# Patient Record
Sex: Male | Born: 1950 | Race: White | Hispanic: No | Marital: Married | Smoking: Never smoker
Health system: Southern US, Community
[De-identification: ages and names within clinical notes are randomized; demographics above are authoritative.]

## PROBLEM LIST (undated history)

## (undated) DIAGNOSIS — D45 Polycythemia vera: Secondary | ICD-10-CM

## (undated) DIAGNOSIS — M109 Gout, unspecified: Secondary | ICD-10-CM

## (undated) DIAGNOSIS — I10 Essential (primary) hypertension: Secondary | ICD-10-CM

## (undated) HISTORY — DX: Polycythemia vera: D45

## (undated) HISTORY — DX: Gout, unspecified: M10.9

## (undated) HISTORY — DX: Essential (primary) hypertension: I10

---

## 2010-06-22 ENCOUNTER — Ambulatory Visit (HOSPITAL_COMMUNITY)
Admission: RE | Admit: 2010-06-22 | Discharge: 2010-06-22 | Payer: Self-pay | Source: Home / Self Care | Admitting: Internal Medicine

## 2010-06-22 ENCOUNTER — Ambulatory Visit: Payer: Self-pay | Admitting: Internal Medicine

## 2012-04-09 ENCOUNTER — Encounter (INDEPENDENT_AMBULATORY_CARE_PROVIDER_SITE_OTHER): Payer: Managed Care, Other (non HMO) | Admitting: Internal Medicine

## 2012-04-09 DIAGNOSIS — N189 Chronic kidney disease, unspecified: Secondary | ICD-10-CM

## 2012-04-09 DIAGNOSIS — R748 Abnormal levels of other serum enzymes: Secondary | ICD-10-CM

## 2012-04-09 DIAGNOSIS — D45 Polycythemia vera: Secondary | ICD-10-CM

## 2012-10-01 ENCOUNTER — Encounter (INDEPENDENT_AMBULATORY_CARE_PROVIDER_SITE_OTHER): Payer: Managed Care, Other (non HMO)

## 2012-10-01 ENCOUNTER — Encounter: Payer: Managed Care, Other (non HMO) | Admitting: Internal Medicine

## 2012-10-01 DIAGNOSIS — D45 Polycythemia vera: Secondary | ICD-10-CM

## 2013-01-05 ENCOUNTER — Encounter (INDEPENDENT_AMBULATORY_CARE_PROVIDER_SITE_OTHER): Payer: Managed Care, Other (non HMO) | Admitting: Internal Medicine

## 2013-01-05 DIAGNOSIS — D45 Polycythemia vera: Secondary | ICD-10-CM

## 2013-05-06 ENCOUNTER — Encounter (INDEPENDENT_AMBULATORY_CARE_PROVIDER_SITE_OTHER): Payer: Managed Care, Other (non HMO)

## 2013-05-20 DIAGNOSIS — D751 Secondary polycythemia: Secondary | ICD-10-CM

## 2013-05-20 DIAGNOSIS — N289 Disorder of kidney and ureter, unspecified: Secondary | ICD-10-CM

## 2013-05-20 DIAGNOSIS — D696 Thrombocytopenia, unspecified: Secondary | ICD-10-CM

## 2013-06-17 ENCOUNTER — Encounter (INDEPENDENT_AMBULATORY_CARE_PROVIDER_SITE_OTHER): Payer: Managed Care, Other (non HMO)

## 2013-06-17 DIAGNOSIS — D751 Secondary polycythemia: Secondary | ICD-10-CM

## 2013-06-17 DIAGNOSIS — D696 Thrombocytopenia, unspecified: Secondary | ICD-10-CM

## 2013-06-17 DIAGNOSIS — R748 Abnormal levels of other serum enzymes: Secondary | ICD-10-CM

## 2013-06-30 ENCOUNTER — Encounter (INDEPENDENT_AMBULATORY_CARE_PROVIDER_SITE_OTHER): Payer: Self-pay | Admitting: *Deleted

## 2013-08-24 ENCOUNTER — Encounter (INDEPENDENT_AMBULATORY_CARE_PROVIDER_SITE_OTHER): Payer: Self-pay

## 2014-09-28 DIAGNOSIS — D751 Secondary polycythemia: Secondary | ICD-10-CM | POA: Diagnosis not present

## 2014-09-30 DIAGNOSIS — D509 Iron deficiency anemia, unspecified: Secondary | ICD-10-CM | POA: Diagnosis not present

## 2014-09-30 DIAGNOSIS — D751 Secondary polycythemia: Secondary | ICD-10-CM | POA: Diagnosis not present

## 2014-09-30 DIAGNOSIS — E611 Iron deficiency: Secondary | ICD-10-CM | POA: Diagnosis not present

## 2014-09-30 DIAGNOSIS — D45 Polycythemia vera: Secondary | ICD-10-CM | POA: Diagnosis not present

## 2014-12-09 DIAGNOSIS — D751 Secondary polycythemia: Secondary | ICD-10-CM | POA: Diagnosis not present

## 2014-12-28 DIAGNOSIS — D45 Polycythemia vera: Secondary | ICD-10-CM | POA: Diagnosis not present

## 2015-03-31 DIAGNOSIS — E611 Iron deficiency: Secondary | ICD-10-CM | POA: Diagnosis not present

## 2015-03-31 DIAGNOSIS — D45 Polycythemia vera: Secondary | ICD-10-CM | POA: Diagnosis not present

## 2015-03-31 DIAGNOSIS — D751 Secondary polycythemia: Secondary | ICD-10-CM | POA: Diagnosis not present

## 2015-05-25 ENCOUNTER — Encounter (INDEPENDENT_AMBULATORY_CARE_PROVIDER_SITE_OTHER): Payer: Self-pay | Admitting: *Deleted

## 2015-07-18 DIAGNOSIS — Z961 Presence of intraocular lens: Secondary | ICD-10-CM | POA: Diagnosis not present

## 2015-07-18 DIAGNOSIS — H43813 Vitreous degeneration, bilateral: Secondary | ICD-10-CM | POA: Diagnosis not present

## 2015-07-19 DIAGNOSIS — D45 Polycythemia vera: Secondary | ICD-10-CM | POA: Diagnosis not present

## 2015-08-04 DIAGNOSIS — H35032 Hypertensive retinopathy, left eye: Secondary | ICD-10-CM | POA: Diagnosis not present

## 2015-08-04 DIAGNOSIS — Z961 Presence of intraocular lens: Secondary | ICD-10-CM | POA: Diagnosis not present

## 2015-08-04 DIAGNOSIS — H04123 Dry eye syndrome of bilateral lacrimal glands: Secondary | ICD-10-CM | POA: Diagnosis not present

## 2015-08-04 DIAGNOSIS — H35031 Hypertensive retinopathy, right eye: Secondary | ICD-10-CM | POA: Diagnosis not present

## 2015-08-04 DIAGNOSIS — H35373 Puckering of macula, bilateral: Secondary | ICD-10-CM | POA: Diagnosis not present

## 2015-08-04 DIAGNOSIS — H5213 Myopia, bilateral: Secondary | ICD-10-CM | POA: Diagnosis not present

## 2015-09-28 DIAGNOSIS — D45 Polycythemia vera: Secondary | ICD-10-CM | POA: Diagnosis not present

## 2015-09-28 DIAGNOSIS — D751 Secondary polycythemia: Secondary | ICD-10-CM | POA: Diagnosis not present

## 2015-09-28 DIAGNOSIS — E611 Iron deficiency: Secondary | ICD-10-CM | POA: Diagnosis not present

## 2015-09-28 DIAGNOSIS — I10 Essential (primary) hypertension: Secondary | ICD-10-CM | POA: Diagnosis not present

## 2015-09-28 DIAGNOSIS — Z6830 Body mass index (BMI) 30.0-30.9, adult: Secondary | ICD-10-CM | POA: Diagnosis not present

## 2015-11-29 DIAGNOSIS — D45 Polycythemia vera: Secondary | ICD-10-CM | POA: Diagnosis not present

## 2015-12-13 DIAGNOSIS — D751 Secondary polycythemia: Secondary | ICD-10-CM | POA: Diagnosis not present

## 2016-01-27 DIAGNOSIS — D751 Secondary polycythemia: Secondary | ICD-10-CM | POA: Diagnosis not present

## 2016-08-28 ENCOUNTER — Encounter: Payer: Self-pay | Admitting: Internal Medicine

## 2016-09-19 ENCOUNTER — Ambulatory Visit (HOSPITAL_COMMUNITY): Payer: Managed Care, Other (non HMO)

## 2016-09-27 ENCOUNTER — Encounter (HOSPITAL_COMMUNITY): Payer: Self-pay

## 2016-09-27 ENCOUNTER — Encounter (HOSPITAL_COMMUNITY): Payer: Medicare Other | Attending: Oncology | Admitting: Oncology

## 2016-09-27 ENCOUNTER — Encounter (HOSPITAL_COMMUNITY): Payer: Medicare Other

## 2016-09-27 DIAGNOSIS — D751 Secondary polycythemia: Secondary | ICD-10-CM | POA: Insufficient documentation

## 2016-09-27 DIAGNOSIS — D45 Polycythemia vera: Secondary | ICD-10-CM | POA: Diagnosis not present

## 2016-09-27 LAB — CBC WITH DIFFERENTIAL/PLATELET
BASOS ABS: 0.1 10*3/uL (ref 0.0–0.1)
BASOS PCT: 1 %
EOS ABS: 0.4 10*3/uL (ref 0.0–0.7)
Eosinophils Relative: 6 %
HCT: 50.2 % (ref 39.0–52.0)
Hemoglobin: 17.3 g/dL — ABNORMAL HIGH (ref 13.0–17.0)
Lymphocytes Relative: 24 %
Lymphs Abs: 1.6 10*3/uL (ref 0.7–4.0)
MCH: 30.1 pg (ref 26.0–34.0)
MCHC: 34.5 g/dL (ref 30.0–36.0)
MCV: 87.5 fL (ref 78.0–100.0)
MONO ABS: 0.6 10*3/uL (ref 0.1–1.0)
MONOS PCT: 9 %
NEUTROS ABS: 4.1 10*3/uL (ref 1.7–7.7)
NEUTROS PCT: 60 %
Platelets: 128 10*3/uL — ABNORMAL LOW (ref 150–400)
RBC: 5.74 MIL/uL (ref 4.22–5.81)
RDW: 13.6 % (ref 11.5–15.5)
WBC: 6.7 10*3/uL (ref 4.0–10.5)

## 2016-09-27 LAB — COMPREHENSIVE METABOLIC PANEL
ALBUMIN: 4 g/dL (ref 3.5–5.0)
ALT: 57 U/L (ref 17–63)
ANION GAP: 5 (ref 5–15)
AST: 48 U/L — AB (ref 15–41)
Alkaline Phosphatase: 59 U/L (ref 38–126)
BUN: 14 mg/dL (ref 6–20)
CHLORIDE: 107 mmol/L (ref 101–111)
CO2: 26 mmol/L (ref 22–32)
Calcium: 9.2 mg/dL (ref 8.9–10.3)
Creatinine, Ser: 1.46 mg/dL — ABNORMAL HIGH (ref 0.61–1.24)
GFR calc Af Amer: 55 mL/min — ABNORMAL LOW (ref >60)
GFR calc non Af Amer: 48 mL/min — ABNORMAL LOW (ref >60)
GLUCOSE: 113 mg/dL — AB (ref 65–99)
POTASSIUM: 3.6 mmol/L (ref 3.5–5.1)
SODIUM: 138 mmol/L (ref 135–145)
Total Bilirubin: 0.8 mg/dL (ref 0.3–1.2)
Total Protein: 7 g/dL (ref 6.5–8.1)

## 2016-09-27 NOTE — Progress Notes (Signed)
[place] NOTE  Patient Care Team: Lance Price. Lance Court, MD as PCP - General  CHIEF COMPLAINTS/PURPOSE OF CONSULTATION:  Polycythemia  HISTORY OF PRESENTING ILLNESS:  Lance Price 66 y.o. male is here because of referral by Dr. Scotty Price for follow up of polycythemia vera. Patient is here because of transfer of care over from Lance Price due to her retiring.    As per Lance Price' note on 07/03/2013: " The patient does not have polycythemia vera. 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 The patient does not need any treatment with Hydrea to maintain an appropriate blood count. The patient has received intermittent phlebotomies in the past and that can continue depending on his hematocrit values. 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 Needs to be followed. He does not have any underlying causes for inappropriate erythropoietin production. The patient needs to be followed with his blood counts and treated with phlebotomy as indicated. Low platelets need to be monitored as well. Liver enzymes are elevated due to underlying liver steatosis."   As per Dr. Reynaldo Price note on 09/28/2015: "Lance Price is a 66 y.o. 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 Since that his hematocrit and hemoglobin has been normal and stable. He is slightly in deficiency state secondary to previous phlebotomies."  His lab results from 11/29/2015 show a WBC of 7.5, RBC of 5.84, Hemoglobin 16 g/dl, hematocrit 50.3%, and platelets at 158k.    Lance Price presents today for consultation and continued treatment of his polycythemia vera. I personally reviewed and went over labs with the patient.  He states his last lab work was done in May 2017. He was supposed to have labs done in July but the office  was closed in June.   He states his initial diagnosis was in 2010.   He states initially he was getting phlebotomies about every 3-6 weeks for first 3-4 years but as the years went on he got his phlebotomies less frequently. Last year he said he only had 1-3 phlebotomies total.   He states he is doing well and plays golf 3x a week. He retired 3 years ago, but is currently works 3 days a week because the person that took over his job when he retired recently quit and so he is working again.   Patient does not have any other questions or concerns at this time.  MEDICAL HISTORY:  Past Medical History:  Diagnosis Date  . Gout   . Hypertension   . Polycythemia vera ([place])     SURGICAL HISTORY: History reviewed. No pertinent surgical history.  SOCIAL HISTORY: Social History   Social History  . Marital status: Married    Spouse name: N/A  . Number of children: N/A  . Years of education: N/A   Occupational History  . Not on file.   Social History Main Topics  . Smoking status: Never Smoker  . Smokeless tobacco: Never Used  . Alcohol use No  . Drug use: No  . Sexual activity: Not Currently   Other Topics Concern  . Not on file   Social History Narrative  . No narrative on file    FAMILY HISTORY: History reviewed. No pertinent family history.  ALLERGIES:  has no allergies on file.  MEDICATIONS:  Current Outpatient Prescriptions  Medication Sig Dispense  Refill  . allopurinol (ZYLOPRIM) 100 MG tablet Take by mouth.    Marland Kitchen aspirin 81 MG chewable tablet Chew by mouth.    . metoprolol (LOPRESSOR) 50 MG tablet Take by mouth.    . triamterene-hydrochlorothiazide (DYAZIDE) 37.5-25 MG capsule Take by mouth.     No current facility-administered medications for this visit.    Review of Systems  Constitutional: Negative.   HENT: Negative.   Eyes: Negative.   Respiratory: Negative.   Cardiovascular: Negative.   Gastrointestinal: Negative.   Genitourinary: Negative.     Musculoskeletal: Negative.   Skin: Negative.   Neurological: Negative.   Endo/Heme/Allergies: Negative.   Psychiatric/Behavioral: Negative.   All other systems reviewed and are negative.  14 point ROS was done and is otherwise as detailed above or in HPI   PHYSICAL EXAMINATION: ECOG PERFORMANCE STATUS: 0 - Asymptomatic  Vitals:   09/27/16 0853  BP: (!) 161/79  Pulse: 60  Resp: 18  Temp: 98.5 F (36.9 C)   Filed Weights   09/27/16 0853  Weight: 187 lb (84.8 kg)     Physical Exam  Constitutional: He is oriented to person, place, and time and well-developed, well-nourished, and in no distress.  HENT:  Head: Normocephalic and atraumatic.  Eyes: Conjunctivae and EOM are normal. Pupils are equal, round, and reactive to light.  Neck: Normal range of motion. Neck supple.  Cardiovascular: Normal rate, regular rhythm and normal heart sounds.   Pulmonary/Chest: Effort normal and breath sounds normal.  Abdominal: Soft. Bowel sounds are normal.  Musculoskeletal: Normal range of motion.  Neurological: He is alert and oriented to person, place, and time. Gait normal.  Skin: Skin is warm and dry.  Nursing note and vitals reviewed.     LABORATORY DATA:  I have reviewed the data as listed No results found for: WBC, HGB, HCT, MCV, PLT CMP  No results found for: NA, K, CL, CO2, GLUCOSE, BUN, CREATININE, CALCIUM, PROT, ALBUMIN, AST, ALT, ALKPHOS, BILITOT, GFRNONAA, GFRAA     PATHOLOGY:     RADIOGRAPHIC STUDIES: I have personally reviewed the radiological images as listed and agreed with the findings in the report. No results found.  CT CHEST, ABDOMEN, AND PELVIS WITH CONTRAST 06/03/2013   IMPRESSION: No evidence of acute cardiopulmonary disease.  No suspicious lymphadenopathy in the chest, abdomen, or pelvis.  Spleen is normal in size.  Moderate hepatic steatosis. Mild wall thickening involving the terminal ileum, although without associated inflammatory changes,  nonspecific.     ASSESSMENT & PLAN:  Suspected secondary polycythemia.  PLAN: He did have a copy of a negative JAK2 exon 12 with him but otherwise I do not have records of his previous workup.  I will check the below labs and discuss the next plan of care at that time. He is asymptomatic.  RTC in 3 weeks for follow up and review lab results. Labs ordered prior to next visit as below.   ORDERS PLACED FOR THIS ENCOUNTER: Orders Placed This Encounter  Procedures  . CBC with Differential  . Comprehensive metabolic panel  . JAK2 V617F, w Reflex to CALR/E12/MPL  . Erythropoietin    MEDICATIONS PRESCRIBED THIS ENCOUNTER: Meds ordered this encounter  Medications  . allopurinol (ZYLOPRIM) 100 MG tablet    Sig: Take by mouth.  Marland Kitchen aspirin 81 MG chewable tablet    Sig: Chew by mouth.  . metoprolol (LOPRESSOR) 50 MG tablet    Sig: Take by mouth.  . triamterene-hydrochlorothiazide (DYAZIDE) 37.5-25 MG capsule    Sig:  Take by mouth.     All questions were answered. The patient knows to call the clinic with any problems, questions or concerns.  This document serves as a record of services personally performed by Twana First, MD. It was created on her behalf by Shirlean Mylar, a trained medical scribe. The creation of this record is based on the scribe's personal observations and the provider's statements to them. This document has been checked and approved by the attending provider.  I have reviewed the above documentation for accuracy and completeness and I agree with the above.  This note was electronically signed.    [employer]  09/27/2016 9:01 AM

## 2016-09-27 NOTE — Patient Instructions (Signed)
[place] at [facility] Discharge Instructions  RECOMMENDATIONS MADE BY THE CONSULTANT AND ANY TEST RESULTS WILL BE SENT TO YOUR REFERRING PHYSICIAN.  You were seen today by Dr. Twana First You will have lab work done today Follow up in 3 weeks See Amy up front for appointments   Thank you for choosing [place] at [facility] to provide your oncology and hematology care.  To afford each patient quality time with our provider, please arrive at least 15 minutes before your scheduled appointment time.    If you have a lab appointment with the [place] please come in thru the  Main Entrance and check in at the main information desk  You need to re-schedule your appointment should you arrive 10 or more minutes late.  We strive to give you quality time with our providers, and arriving late affects you and other patients whose appointments are after yours.  Also, if you no show three or more times for appointments you may be dismissed from the clinic at the providers discretion.     Again, thank you for choosing [facility].  Our hope is that these requests will decrease the amount of time that you wait before being seen by our physicians.       _____________________________________________________________  Should you have questions after your visit to [facility], please contact our office at [phone] between the hours of 8:30 a.m. and 4:30 p.m.  Voicemails left after 4:30 p.m. will not be returned until the following business day.  For prescription refill requests, have your pharmacy contact our office.       Resources For Cancer Patients and their Caregivers ? American Cancer Society: Can assist with transportation, wigs, general needs, runs Look Good Feel Better.        [phone] ? Cancer Care: Provides financial assistance, online support groups, medication/co-pay assistance.  1-800-813-HOPE  531-694-0572) ? [place] Assists [place] cancer patients and their families through emotional , educational and financial support.  772-438-3952 ? [place] DSS Where to apply for food stamps, Medicaid and utility assistance. 6308423661 ? RCATS: Transportation to medical appointments. 407-863-2561 ? Social Security Administration: May apply for disability if have a Stage IV cancer. 854-138-1306 [phone] ? [employer], Disability and Transit Services: Assists with nutrition, care and transit needs. [place] Support Programs: @10RELATIVEDAYS @ > Cancer Support Group  2nd Tuesday of the month 1pm-2pm, Journey Room  > Creative Journey  3rd Tuesday of the month 1130am-1pm, Journey Room  > Look Good Feel Better  1st Wednesday of the month 10am-12 noon, Journey Room (Call [place] to register [phone])

## 2016-10-05 LAB — CALR + JAK2 E12-15 + MPL (REFLEXED)

## 2016-10-05 LAB — JAK2 V617F, W REFLEX TO CALR/E12/MPL

## 2016-10-17 NOTE — Progress Notes (Signed)
[place], [place] [address]   CLINIC:  Medical Oncology/Hematology  PCP:  Deloria Lair, MD [place] [address] [phone]   REASON FOR VISIT:  Follow-up for Polycythemia vera  CURRENT THERAPY: Therapeutic phlebotomy PRN    HISTORY OF PRESENT ILLNESS:  (From Dr. Laverle Patter last note on 09/27/16)      INTERVAL HISTORY:  Lance Price 66 y.o. male presents for routine follow-up for polycythemia vera.   Overall, he reports feeling quite well. He is largely without complaints today. He remains active playing golf several days per week. He continues to work part-time as well.   For fun, he enjoys play golf and spending time with his grandchildren; granddaughter Johann Capers (22 yo) and grandson Phillip Heal (46 yo). His only son and the grandchildren live outside of [address] [place].    REVIEW OF SYSTEMS:  Review of Systems  Constitutional: Negative.  Negative for chills, fatigue and fever.  HENT:  Negative.  Negative for lump/mass and nosebleeds.   Eyes: Negative.   Respiratory: Negative.  Negative for cough and shortness of breath.   Cardiovascular: Negative.  Negative for chest pain and leg swelling.  Gastrointestinal: Negative.  Negative for abdominal pain, blood in stool, constipation, diarrhea, nausea and vomiting.  Endocrine: Negative.   Genitourinary: Negative.  Negative for dysuria and hematuria.   Musculoskeletal: Negative.  Negative for arthralgias.  Skin: Negative.  Negative for rash.  Neurological: Negative.  Negative for dizziness and headaches.  Hematological: Negative.  Negative for adenopathy. Does not bruise/bleed easily.  Psychiatric/Behavioral: Negative.  Negative for depression and sleep disturbance. The patient is not nervous/anxious.      PAST MEDICAL/SURGICAL HISTORY:  Past Medical History:  Diagnosis Date  . Gout   . Hypertension   . Polycythemia vera ([place])    History reviewed. No pertinent surgical  history.   SOCIAL HISTORY:  Social History   Social History  . Marital status: Married    Spouse name: N/A  . Number of children: N/A  . Years of education: N/A   Occupational History  . Not on file.   Social History Main Topics  . Smoking status: Never Smoker  . Smokeless tobacco: Never Used  . Alcohol use No  . Drug use: No  . Sexual activity: Not Currently   Other Topics Concern  . Not on file   Social History Narrative  . No narrative on file    FAMILY HISTORY:  History reviewed. No pertinent family history.  CURRENT MEDICATIONS:  Outpatient Encounter Prescriptions as of 10/18/2016  Medication Sig  . allopurinol (ZYLOPRIM) 100 MG tablet Take by mouth.  Marland Kitchen aspirin 81 MG chewable tablet Chew by mouth.  . metoprolol (LOPRESSOR) 50 MG tablet Take by mouth.  . triamterene-hydrochlorothiazide (DYAZIDE) 37.5-25 MG capsule Take by mouth.   No facility-administered encounter medications on file as of 10/18/2016.     ALLERGIES:  Not on File   PHYSICAL EXAM:  ECOG Performance status: 0 - Asymptomatic   Vitals:   10/18/16 1150 10/18/16 1204  BP: (!) 156/78 (!) 156/78  Pulse: (!) 58 (!) 58  Resp: 16 16  Temp: 98.7 F (37.1 C) 98.7 F (37.1 C)   Filed Weights   10/18/16 1150 10/18/16 1204  Weight: 185 lb (83.9 kg) 185 lb (83.9 kg)    Physical Exam  Constitutional: He is oriented to person, place, and time and well-developed, well-nourished, and in no distress.  HENT:  Head: Normocephalic.  Mouth/Throat: Oropharynx is clear and moist. No oropharyngeal exudate.  Eyes: Conjunctivae are normal. Pupils are equal, round, and reactive to light. No scleral icterus.  Neck: Normal range of motion. Neck supple.  Cardiovascular: Normal rate, regular rhythm and normal heart sounds.   Pulmonary/Chest: Effort normal and breath sounds normal. No respiratory distress.  Abdominal: Soft. Bowel sounds are normal. There is no tenderness.  Musculoskeletal: Normal range of  motion. He exhibits no edema.  Lymphadenopathy:    He has no cervical adenopathy.       Right: No supraclavicular adenopathy present.       Left: No supraclavicular adenopathy present.  Neurological: He is alert and oriented to person, place, and time. No cranial nerve deficit. Gait normal.  Skin: Skin is warm and dry. No rash noted.  Psychiatric: Mood, memory, affect and judgment normal.  Nursing note and vitals reviewed.    LABORATORY DATA:  I have reviewed the labs as listed.  CBC    Component Value Date/Time   WBC 6.7 09/27/2016 0910   RBC 5.74 09/27/2016 0910   HGB 17.3 (H) 09/27/2016 0910   HCT 50.2 09/27/2016 0910   PLT 128 (L) 09/27/2016 0910   MCV 87.5 09/27/2016 0910   MCH 30.1 09/27/2016 0910   MCHC 34.5 09/27/2016 0910   RDW 13.6 09/27/2016 0910   LYMPHSABS 1.6 09/27/2016 0910   MONOABS 0.6 09/27/2016 0910   EOSABS 0.4 09/27/2016 0910   BASOSABS 0.1 09/27/2016 0910   CMP Latest Ref Rng & Units 09/27/2016  Glucose 65 - 99 mg/dL 113(H)  BUN 6 - 20 mg/dL 14  Creatinine 0.61 - 1.24 mg/dL 1.46(H)  Sodium 135 - 145 mmol/L 138  Potassium 3.5 - 5.1 mmol/L 3.6  Chloride 101 - 111 mmol/L 107  CO2 22 - 32 mmol/L 26  Calcium 8.9 - 10.3 mg/dL 9.2  Total Protein 6.5 - 8.1 g/dL 7.0  Total Bilirubin 0.3 - 1.2 mg/dL 0.8  Alkaline Phos 38 - 126 U/L 59  AST 15 - 41 U/L 48(H)  ALT 17 - 63 U/L 57        PENDING LABS:    DIAGNOSTIC IMAGING:    PATHOLOGY:  CALR/JAK2 status: 09/27/16        ASSESSMENT & PLAN:   Polycythemia vera/Thrombocytopenia:  -JAK2 & CALR negative.  -Labs reviewed from 09/27/16 in detail with the patient today.  Hemoglobin 17.3/hematocrit 50.2. He has not had therapeutic phlebotomy since 12/2015. Given the stability of his hematocrit and patient being asymptomatic, we will hold off on any urgent phlebotomies until further evaluation is completed with US abdomen.  -We discussed possible causes of low platelets; differential diagnosis includes  fatty liver disease.  -Will obtain ultrasound abdomen for further evaluation of any possible liver disease or splenomegaly.   Mildly elevated AST:  -Could be secondary to fatty liver disease. Will obtain ultrasound abdomen for further evaluation.      Dispo:  -Ultrasound abd sometime within the next 1-2 weeks.  -Return to cancer center in 3-4 weeks for follow-up visit with Dr. Talbert Cage.    All questions were answered to patient's stated satisfaction. Encouraged patient to call with any new concerns or questions before his next visit to the cancer center and we can certain see him sooner, if needed.    Plan of care discussed with Dr. Talbert Cage, who agrees with the above aforementioned.      Orders placed this encounter:  Orders Placed This Encounter  Procedures  . US Abdomen Complete  .  CBC with Differential/Platelet  . Comprehensive metabolic panel  . Erythropoietin      Mike Craze, NP [place] [phone]

## 2016-10-18 ENCOUNTER — Encounter (HOSPITAL_COMMUNITY): Payer: Self-pay | Admitting: Adult Health

## 2016-10-18 ENCOUNTER — Encounter (HOSPITAL_BASED_OUTPATIENT_CLINIC_OR_DEPARTMENT_OTHER): Payer: Medicare Other | Admitting: Adult Health

## 2016-10-18 VITALS — BP 156/78 | HR 58 | Temp 98.7°F | Resp 16 | Ht 66.0 in | Wt 185.0 lb

## 2016-10-18 DIAGNOSIS — D45 Polycythemia vera: Secondary | ICD-10-CM | POA: Diagnosis not present

## 2016-10-18 DIAGNOSIS — D696 Thrombocytopenia, unspecified: Secondary | ICD-10-CM | POA: Diagnosis not present

## 2016-10-18 NOTE — Patient Instructions (Signed)
[place] at [facility] Discharge Instructions  RECOMMENDATIONS MADE BY THE CONSULTANT AND ANY TEST RESULTS WILL BE SENT TO YOUR REFERRING PHYSICIAN.  You were seen today by Mike Craze NP. [place] of abdomen in 1-2 weeks. Return in 3 weeks for follow up.   Thank you for choosing [place] at [facility] to provide your oncology and hematology care.  To afford each patient quality time with our provider, please arrive at least 15 minutes before your scheduled appointment time.    If you have a lab appointment with the [place] please come in thru the  Main Entrance and check in at the main information desk  You need to re-schedule your appointment should you arrive 10 or more minutes late.  We strive to give you quality time with our providers, and arriving late affects you and other patients whose appointments are after yours.  Also, if you no show three or more times for appointments you may be dismissed from the clinic at the providers discretion.     Again, thank you for choosing [facility].  Our hope is that these requests will decrease the amount of time that you wait before being seen by our physicians.       _____________________________________________________________  Should you have questions after your visit to [facility], please contact our office at [phone] between the hours of 8:30 a.m. and 4:30 p.m.  Voicemails left after 4:30 p.m. will not be returned until the following business day.  For prescription refill requests, have your pharmacy contact our office.       Resources For Cancer Patients and their Caregivers ? American Cancer Society: Can assist with transportation, wigs, general needs, runs Look Good Feel Better.        ([phone] ? Cancer Care: Provides financial assistance, online support groups, medication/co-pay assistance.  1-800-813-HOPE 225-106-9425) ? [place] Assists [place] cancer patients and their families through emotional , educational and financial support.  (801) 515-4031 ? [place] DSS Where to apply for food stamps, Medicaid and utility assistance. ([phone] 103-5490 ? RCATS: Transportation to medical appointments. 212-516-2136 ? Social Security Administration: May apply for disability if have a Stage IV cancer. 605-530-8184 520 201 9086 ? [employer], Disability and Transit Services: Assists with nutrition, care and transit needs. [place] Support Programs: @10RELATIVEDAYS @ > Cancer Support Group  2nd Tuesday of the month 1pm-2pm, Journey Room  > Creative Journey  3rd Tuesday of the month 1130am-1pm, Journey Room  > Look Good Feel Better  1st Wednesday of the month 10am-12 noon, Journey Room (Call [place] to register [phone])

## 2016-11-01 ENCOUNTER — Ambulatory Visit (HOSPITAL_COMMUNITY)
Admission: RE | Admit: 2016-11-01 | Discharge: 2016-11-01 | Disposition: A | Discharge status: Home / Self Care | Payer: Medicare Other | Source: Ambulatory Visit | Attending: Adult Health | Admitting: Adult Health

## 2016-11-01 DIAGNOSIS — K760 Fatty (change of) liver, not elsewhere classified: Secondary | ICD-10-CM | POA: Diagnosis not present

## 2016-11-01 DIAGNOSIS — D45 Polycythemia vera: Secondary | ICD-10-CM | POA: Diagnosis not present

## 2016-11-08 ENCOUNTER — Encounter (HOSPITAL_COMMUNITY): Payer: Self-pay

## 2016-11-08 ENCOUNTER — Encounter (HOSPITAL_COMMUNITY): Payer: Medicare Other | Attending: Oncology | Admitting: Oncology

## 2016-11-08 ENCOUNTER — Encounter (HOSPITAL_COMMUNITY): Payer: Medicare Other

## 2016-11-08 VITALS — BP 142/86 | HR 62 | Temp 98.6°F | Resp 18 | Wt 184.3 lb

## 2016-11-08 DIAGNOSIS — D45 Polycythemia vera: Secondary | ICD-10-CM | POA: Diagnosis not present

## 2016-11-08 LAB — COMPREHENSIVE METABOLIC PANEL
ALT: 71 U/L — ABNORMAL HIGH (ref 17–63)
ANION GAP: 9 (ref 5–15)
AST: 58 U/L — AB (ref 15–41)
Albumin: 4.2 g/dL (ref 3.5–5.0)
Alkaline Phosphatase: 74 U/L (ref 38–126)
BILIRUBIN TOTAL: 0.8 mg/dL (ref 0.3–1.2)
BUN: 15 mg/dL (ref 6–20)
CHLORIDE: 106 mmol/L (ref 101–111)
CO2: 23 mmol/L (ref 22–32)
Calcium: 9.3 mg/dL (ref 8.9–10.3)
Creatinine, Ser: 1.52 mg/dL — ABNORMAL HIGH (ref 0.61–1.24)
GFR calc Af Amer: 53 mL/min — ABNORMAL LOW (ref >60)
GFR, EST NON AFRICAN AMERICAN: 45 mL/min — AB (ref >60)
GLUCOSE: 121 mg/dL — AB (ref 65–99)
POTASSIUM: 3.7 mmol/L (ref 3.5–5.1)
Sodium: 138 mmol/L (ref 135–145)
Total Protein: 7.3 g/dL (ref 6.5–8.1)

## 2016-11-08 LAB — CBC WITH DIFFERENTIAL/PLATELET
BASOS ABS: 0.1 10*3/uL (ref 0.0–0.1)
Basophils Relative: 1 %
Eosinophils Absolute: 0.5 10*3/uL (ref 0.0–0.7)
Eosinophils Relative: 6 %
HEMATOCRIT: 52.6 % — AB (ref 39.0–52.0)
Hemoglobin: 18.1 g/dL — ABNORMAL HIGH (ref 13.0–17.0)
LYMPHS ABS: 2 10*3/uL (ref 0.7–4.0)
LYMPHS PCT: 26 %
MCH: 30.4 pg (ref 26.0–34.0)
MCHC: 34.4 g/dL (ref 30.0–36.0)
MCV: 88.4 fL (ref 78.0–100.0)
MONO ABS: 0.7 10*3/uL (ref 0.1–1.0)
MONOS PCT: 9 %
NEUTROS ABS: 4.7 10*3/uL (ref 1.7–7.7)
Neutrophils Relative %: 58 %
Platelets: 149 10*3/uL — ABNORMAL LOW (ref 150–400)
RBC: 5.95 MIL/uL — ABNORMAL HIGH (ref 4.22–5.81)
RDW: 13.3 % (ref 11.5–15.5)
WBC: 8 10*3/uL (ref 4.0–10.5)

## 2016-11-08 NOTE — Patient Instructions (Signed)
[place] at [facility] Discharge Instructions  RECOMMENDATIONS MADE BY THE CONSULTANT AND ANY TEST RESULTS WILL BE SENT TO YOUR REFERRING PHYSICIAN.  You were seen today by Dr. Twana First Follow up in 3 months with lab work We will refer you to pulmonary for sleep study See Amy up front for appointments   Thank you for choosing [place] at [facility] to provide your oncology and hematology care.  To afford each patient quality time with our provider, please arrive at least 15 minutes before your scheduled appointment time.    If you have a lab appointment with the [place] please come in thru the  Main Entrance and check in at the main information desk  You need to re-schedule your appointment should you arrive 10 or more minutes late.  We strive to give you quality time with our providers, and arriving late affects you and other patients whose appointments are after yours.  Also, if you no show three or more times for appointments you may be dismissed from the clinic at the providers discretion.     Again, thank you for choosing [facility].  Our hope is that these requests will decrease the amount of time that you wait before being seen by our physicians.       _____________________________________________________________  Should you have questions after your visit to [facility], please contact our office at [phone] between the hours of 8:30 a.m. and 4:30 p.m.  Voicemails left after 4:30 p.m. will not be returned until the following business day.  For prescription refill requests, have your pharmacy contact our office.       Resources For Cancer Patients and their Caregivers ? American Cancer Society: Can assist with transportation, wigs, general needs, runs Look Good Feel Better.        [phone] ? Cancer Care: Provides financial assistance, online support groups, medication/co-pay  assistance.  1-800-813-HOPE 657-101-2011) ? [facility] Assists [place] cancer patients and their families through emotional , educational and financial support.  415-551-4609 ? [place] DSS Where to apply for food stamps, Medicaid and utility assistance. 507-327-1443 ? RC[employer]: Transportation to medical appointments. 765-440-7265 ? Social Security Administration: May apply for disability if have a Stage IV cancer. 872-393-8285 714-226-0410 ? [employer], Disability and Transit Services: Assists with nutrition, care and transit needs. [place] Support Programs: @10RELATIVEDAYS @ > Cancer Support Group  2nd Tuesday of the month 1pm-2pm, Journey Room  > Creative Journey  3rd Tuesday of the month 1130am-1pm, Journey Room  > Look Good Feel Better  1st Wednesday of the month 10am-12 noon, Journey Room (Call [place] to register [phone])

## 2016-11-08 NOTE — Progress Notes (Signed)
[facility] [place], [place] [address]   CLINIC:  Medical Oncology/Hematology  PCP:  Deloria Lair, MD [place] [address] [phone]   REASON FOR VISIT:  Follow-up for Polycythemia vera  CURRENT THERAPY: Therapeutic phlebotomy PRN    HISTORY OF PRESENT ILLNESS:      INTERVAL HISTORY:  Lance Price 66 y.o. male presents for routine follow-up for secondary polycythemia vera, JAK2 negative.  Patient states he feels well today. He had an abd US performed after his last visit to evaluate for splenomegaly since he had new onset thrombocytopenia. His abd [place] was negative except for mild hepatosteatosis. Platelets have improved on their own and is up to 149k today. He has no complaints today.   REVIEW OF SYSTEMS:  Review of Systems  Constitutional: Negative.  Negative for appetite change, chills, fatigue and fever.  HENT:  Negative.  Negative for hearing loss, lump/mass, mouth sores, nosebleeds, sore throat and tinnitus.   Eyes: Negative.  Negative for eye problems and icterus.  Respiratory: Negative.  Negative for chest tightness, cough, hemoptysis, shortness of breath and wheezing.   Cardiovascular: Negative.  Negative for chest pain, leg swelling and palpitations.  Gastrointestinal: Negative.  Negative for abdominal distention, abdominal pain, blood in stool, constipation, diarrhea, nausea and vomiting.  Endocrine: Negative.  Negative for hot flashes.  Genitourinary: Negative.  Negative for difficulty urinating, dysuria, frequency and hematuria.   Musculoskeletal: Negative.  Negative for arthralgias and neck pain.  Skin: Negative.  Negative for itching and rash.  Neurological: Negative.  Negative for dizziness, headaches and speech difficulty.  Hematological: Negative.  Negative for adenopathy. Does not bruise/bleed easily.  Psychiatric/Behavioral: Negative.  Negative for confusion, depression and sleep disturbance. The patient  is not nervous/anxious.      PAST MEDICAL/SURGICAL HISTORY:  Past Medical History:  Diagnosis Date  . Gout   . Hypertension   . Polycythemia vera ([place])    History reviewed. No pertinent surgical history.   SOCIAL HISTORY:  Social History   Social History  . Marital status: Married    Spouse name: N/A  . Number of children: N/A  . Years of education: N/A   Occupational History  . Not on file.   Social History Main Topics  . Smoking status: Never Smoker  . Smokeless tobacco: Never Used  . Alcohol use No  . Drug use: No  . Sexual activity: Not Currently   Other Topics Concern  . Not on file   Social History Narrative  . No narrative on file    FAMILY HISTORY:  History reviewed. No pertinent family history.  CURRENT MEDICATIONS:  Outpatient Encounter Prescriptions as of 11/08/2016  Medication Sig  . allopurinol (ZYLOPRIM) 100 MG tablet Take by mouth.  Marland Kitchen aspirin 81 MG tablet Take 81 mg by mouth daily.  . metoprolol (LOPRESSOR) 50 MG tablet Take by mouth.  . triamterene-hydrochlorothiazide (DYAZIDE) 37.5-25 MG capsule Take by mouth.  . [DISCONTINUED] aspirin 81 MG chewable tablet Chew by mouth.   No facility-administered encounter medications on file as of 11/08/2016.     ALLERGIES:  Not on File   PHYSICAL EXAM:  ECOG Performance status: 0 - Asymptomatic   Vitals:   11/08/16 0946  BP: (!) 142/86  Pulse: 62  Resp: 18  Temp: 98.6 F (37 C)   Filed Weights   11/08/16 0946  Weight: 184 lb 4.8 oz (83.6 kg)    Physical Exam  Constitutional: He is oriented to  person, place, and time and well-developed, well-nourished, and in no distress. No distress.  HENT:  Head: Normocephalic and atraumatic.  Mouth/Throat: Oropharynx is clear and moist. No oropharyngeal exudate.  Eyes: Conjunctivae are normal. Pupils are equal, round, and reactive to light. No scleral icterus.  Neck: Normal range of motion. Neck supple. No JVD present.  Cardiovascular: Normal rate,  regular rhythm and normal heart sounds.  Exam reveals no gallop and no friction rub.   No murmur heard. Pulmonary/Chest: Effort normal and breath sounds normal. No respiratory distress. He has no wheezes. He has no rales.  Abdominal: Soft. Bowel sounds are normal. He exhibits no distension. There is no tenderness. There is no guarding.  Musculoskeletal: Normal range of motion. He exhibits no edema or tenderness.  Lymphadenopathy:    He has no cervical adenopathy.       Right: No supraclavicular adenopathy present.       Left: No supraclavicular adenopathy present.  Neurological: He is alert and oriented to person, place, and time. No cranial nerve deficit. Gait normal.  Skin: Skin is warm and dry. No rash noted. No erythema. No pallor.  Psychiatric: Mood, memory, affect and judgment normal.  Nursing note and vitals reviewed.    LABORATORY DATA:  I have reviewed the labs as listed.  CBC    Component Value Date/Time   WBC 8.0 11/08/2016 0904   RBC 5.95 (H) 11/08/2016 0904   HGB 18.1 (H) 11/08/2016 0904   HCT 52.6 (H) 11/08/2016 0904   PLT 149 (L) 11/08/2016 0904   MCV 88.4 11/08/2016 0904   MCH 30.4 11/08/2016 0904   MCHC 34.4 11/08/2016 0904   RDW 13.3 11/08/2016 0904   LYMPHSABS 2.0 11/08/2016 0904   MONOABS 0.7 11/08/2016 0904   EOSABS 0.5 11/08/2016 0904   BASOSABS 0.1 11/08/2016 0904   CMP Latest Ref Rng & Units 11/08/2016 09/27/2016  Glucose 65 - 99 mg/dL 121(H) 113(H)  BUN 6 - 20 mg/dL 15 14  Creatinine 0.61 - 1.24 mg/dL 1.52(H) 1.46(H)  Sodium 135 - 145 mmol/L 138 138  Potassium 3.5 - 5.1 mmol/L 3.7 3.6  Chloride 101 - 111 mmol/L 106 107  CO2 22 - 32 mmol/L 23 26  Calcium 8.9 - 10.3 mg/dL 9.3 9.2  Total Protein 6.5 - 8.1 g/dL 7.3 7.0  Total Bilirubin 0.3 - 1.2 mg/dL 0.8 0.8  Alkaline Phos 38 - 126 U/L 74 59  AST 15 - 41 U/L 58(H) 48(H)  ALT 17 - 63 U/L 71(H) 57        DIAGNOSTIC IMAGING:  EXAM: ABDOMEN ULTRASOUND COMPLETE  COMPARISON:  [facility] CT chest abdomen and pelvis 06/03/2013.  FINDINGS: Gallbladder: No gallstones or wall thickening visualized. No sonographic Murphy sign noted by sonographer.  Common bile duct: Diameter: 4 mm, normal  Liver: Mildly increased liver echogenicity compared to the right kidney. Overall liver size appears within normal limits. No discrete liver lesion. No intrahepatic biliary ductal dilatation.  IVC: No abnormality visualized.  Pancreas: Diminutive on the prior CT. Incompletely visualized due to overlying bowel gas, visualized portions within normal limits.  Spleen: Splenic size appears stable and within normal limits, splenic length 9.6 cm today. No discrete splenic lesion.  Right Kidney: Length: 12.3 cm. Chronic cortical volume loss. Echogenicity within normal limits. No mass or hydronephrosis visualized.  Left Kidney: Length: 12.3 cm. Chronic cortical volume loss. Small chronic and simple appearing 18 mm upper pole cortical cyst. Echogenicity within normal limits. No mass or hydronephrosis visualized.  Abdominal aorta: No aneurysm visualized.  Other findings: None.  IMPRESSION: 1. Negative for hepatosplenomegaly. Mild hepatic steatosis suspected. No discrete liver or splenic lesion. 2. No acute findings in the abdomen.   PATHOLOGY:  CALR/JAK2 status: 09/27/16        ASSESSMENT & PLAN:   Secondary Polycythemia vera/Thrombocytopenia:  -JAK2 & CALR negative.  - Suspect his polycythemia may be due to underlying OSA. He states that he snores a lot at night time. Will refer to pulmonary for sleep study. - No phlebotomies necessary at this time. Patient states he feels very drained after receiving phlebotomies.  Mildly elevated AST:  -secondary to fatty liver disease.  - counseled on low fat/low cholesterol diet.   Dispo:  -Return to cancer center in 3 months for follow up with labs   Orders placed this encounter:  Orders Placed This Encounter   Procedures  . CBC with Differential    Standing Status:   Future    Standing Expiration Date:   04/10/2017  . Comprehensive metabolic panel    Standing Status:   Future    Standing Expiration Date:   04/10/2017

## 2016-11-09 LAB — ERYTHROPOIETIN: Erythropoietin: 13.5 m[IU]/mL (ref 2.6–18.5)

## 2016-11-23 ENCOUNTER — Institutional Professional Consult (permissible substitution): Payer: Medicare Other | Admitting: Pulmonary Disease

## 2016-12-13 ENCOUNTER — Encounter: Payer: Self-pay | Admitting: Pulmonary Disease

## 2016-12-13 ENCOUNTER — Ambulatory Visit (INDEPENDENT_AMBULATORY_CARE_PROVIDER_SITE_OTHER): Payer: Medicare Other | Admitting: Pulmonary Disease

## 2016-12-13 VITALS — BP 132/72 | HR 59 | Ht 66.0 in | Wt 183.8 lb

## 2016-12-13 DIAGNOSIS — G4733 Obstructive sleep apnea (adult) (pediatric): Secondary | ICD-10-CM | POA: Diagnosis not present

## 2016-12-13 DIAGNOSIS — G4710 Hypersomnia, unspecified: Secondary | ICD-10-CM

## 2016-12-13 NOTE — Assessment & Plan Note (Signed)
Patient was diagnosed with polycythemia vera in 2010. He follows up with Dr.Zhou. She recently saw him a couple months ago. Workup has been unremarkable. She was concerned about sleep apnea causing PV.   Patient is married, has snoring. He snores, occasional gasping or choking. He sleeps 5-6 hrs/night. Has naps in pm. Has some hypersomnia in am.  Hypersomnia affects his fxnality.  (-) abnormal behavior in sleep.   ESS 6.   Plan :  We discussed about the diagnosis of Obstructive Sleep Apnea (OSA) and implications of untreated OSA. We discussed about CPAP and BiPaP as possible treatment options.    We will schedule the patient for a sleep study. Plan for home sleep study. Suspicion for him having sleep apnea is low. If home sleep study is negative, I do not necessarily think he needs a lab study. If sleep study is positive, I will try CPAP therapy for several months and see how it affects his PV. He is not necessarily too symptomatic from hypersomnia point of view.   Patient was instructed to call the office if he/she has not heard back from the office 1-2 weeks after the sleep study.   Patient was instructed to call the office if he/she is having issues with the PAP device.   We discussed good sleep hygiene.   Patient was advised not to engage in activities requiring concentration and/or vigilance if he/she is sleepy.  Patient was advised not to drive if he/she is sleepy.

## 2016-12-13 NOTE — Patient Instructions (Signed)
It was a pleasure taking care of you today!  We will schedule you to have a sleep study to determine if you have sleep apnea.   We will get a home sleep test.  You will be instructed to come back to the office to get an apparatus to sleep with overnight.  Once we have the apparatus, it will usually take [place] 1-2 weeks to read the study and get back at you with results of the test.  Please give [place] a call in 2 weeks after your study if you do not hear back from [place].    If the sleep study is positive, we will order you a CPAP  machine.  Please call the office if you do NOT receive your machine in the next 1-2 weeks.   Please make sure you use your CPAP device everytime you sleep.  We will monitor the usage of your machine per your insurance requirement.  Your insurance company may take the machine from you if you are not using it regularly.   Please clean the mask, tubings, filter, water reservoir with soapy water every week.  Please use distilled water for the water reservoir.   Please call the office or your machine provider (DME company) if you are having issues with the device.   Return to clinic in 8-10 weeks with NP

## 2016-12-13 NOTE — Progress Notes (Signed)
Subjective:    Patient ID: Lance Price, male    DOB: July 21, 1951, 66 y.o.   MRN: [id number]  HPI   This is the case of Lance Price, 66 y.o. Male, who was referred by Dr. Twana First in consultation regarding possible OSA.    As you very well know, patient non smoker, not known  to have asthma or copd. Patient was diagnosed with polycythemia vera in 2010. He follows up with Dr.Zhou. She recently saw him a couple months ago. Workup has been unremarkable. She was concerned about sleep apnea causing PV.   Patient is married, has snoring. He snores, occasional gasping or choking. He sleeps 5-6 hrs/night. Has naps in pm. Has some hypersomnia in am.  Hypersomnia affects his fxnality.  (-) abnormal behavior in sleep.    ESS 6    Review of Systems  Constitutional: Negative.  Negative for fever and unexpected weight change.  HENT: Negative.  Negative for congestion, dental problem, ear pain, nosebleeds, postnasal drip, rhinorrhea, sinus pressure, sneezing, sore throat and trouble swallowing.   Eyes: Negative.  Negative for redness and itching.  Respiratory: Negative.  Negative for cough, chest tightness, shortness of breath and wheezing.   Cardiovascular: Negative.  Negative for palpitations and leg swelling.  Gastrointestinal: Negative.  Negative for nausea and vomiting.  Endocrine: Negative.   Genitourinary: Negative.  Negative for dysuria.  Musculoskeletal: Negative.  Negative for joint swelling.  Skin: Positive for pallor. Negative for rash.  Allergic/Immunologic: Negative.  Negative for environmental allergies, food allergies and immunocompromised state.  Neurological: Negative.  Negative for headaches.  Hematological: Negative.  Does not bruise/bleed easily.  Psychiatric/Behavioral: Negative.  Negative for dysphoric mood. The patient is not nervous/anxious.     Past Medical History:  Diagnosis Date  . Gout   . Hypertension   . Polycythemia vera ([place])     (-) CA, DVT CKD  st 3  No family history on file.  Mother had lung CA. Father had CAD.   No past surgical history on file.  S/P rectal abscess surgery.   Social History   Social History  . Marital status: Married    Spouse name: N/A  . Number of children: N/A  . Years of education: N/A   Occupational History  . Not on file.   Social History Main Topics  . Smoking status: Never Smoker  . Smokeless tobacco: Never Used  . Alcohol use No  . Drug use: No  . Sexual activity: Not Currently   Other Topics Concern  . Not on file   Social History Narrative  . No narrative on file   Lives in [place], works part time, has a son.   No Known Allergies   Outpatient Medications Prior to Visit  Medication Sig Dispense Refill  . allopurinol (ZYLOPRIM) 100 MG tablet Take by mouth.    Marland Kitchen aspirin 81 MG tablet Take 81 mg by mouth daily.    . metoprolol (LOPRESSOR) 50 MG tablet Take by mouth.    . triamterene-hydrochlorothiazide (DYAZIDE) 37.5-25 MG capsule Take by mouth.     No facility-administered medications prior to visit.    No orders of the defined types were placed in this encounter.        Objective:   Physical Exam  Vitals:  Vitals:   12/13/16 1535  BP: 132/72  Pulse: (!) 59  SpO2: 97%  Weight: 183 lb 12.8 oz (83.4 kg)  Height: 5\' 6"  (1.676 m)    Constitutional/General:  Pleasant, well-nourished,  well-developed, not in any distress,  Comfortably seating.  Well kempt  Body mass index is 29.67 kg/m. Wt Readings from Last 3 Encounters:  12/13/16 183 lb 12.8 oz (83.4 kg)  11/08/16 184 lb 4.8 oz (83.6 kg)  10/18/16 185 lb (83.9 kg)     HEENT: Pupils equal and reactive to light and accommodation. Anicteric sclerae. Normal nasal mucosa.   No oral  lesions,  mouth clear,  oropharynx clear, no postnasal drip. (-) Oral thrush. No dental caries.  Airway - Mallampati class III  Neck: No masses. Midline trachea. No JVD, (-) LAD. (-) bruits appreciated.  Respiratory/Chest: Grossly  normal chest. (-) deformity. (-) Accessory muscle use.  Symmetric expansion. (-) Tenderness on palpation.  Resonant on percussion.  Diminished BS on both lower lung zones. (-) wheezing, crackles, rhonchi (-) egophony  Cardiovascular: Regular rate and  rhythm, heart sounds normal, no murmur or gallops, no peripheral edema  Gastrointestinal:  Normal bowel sounds. Soft, non-tender. No hepatosplenomegaly.  (-) masses.   Musculoskeletal:  Normal muscle tone. Normal gait.   Extremities: Grossly normal. (-) clubbing, cyanosis.  (-) edema  Skin: (-) rash,lesions seen.   Neurological/Psychiatric : alert, oriented to time, place, person. Normal mood and affect          Assessment & Plan:  Hypersomnia Patient was diagnosed with polycythemia vera in 2010. He follows up with Dr.Zhou. She recently saw him a couple months ago. Workup has been unremarkable. She was concerned about sleep apnea causing PV.   Patient is married, has snoring. He snores, occasional gasping or choking. He sleeps 5-6 hrs/night. Has naps in pm. Has some hypersomnia in am.  Hypersomnia affects his fxnality.  (-) abnormal behavior in sleep.   ESS 6.   Plan :  We discussed about the diagnosis of Obstructive Sleep Apnea (OSA) and implications of untreated OSA. We discussed about CPAP and BiPaP as possible treatment options.    We will schedule the patient for a sleep study. Plan for home sleep study. Suspicion for him having sleep apnea is low. If home sleep study is negative, I do not necessarily think he needs a lab study. If sleep study is positive, I will try CPAP therapy for several months and see how it affects his PV. He is not necessarily too symptomatic from hypersomnia point of view.   Patient was instructed to call the office if he/she has not heard back from the office 1-2 weeks after the sleep study.   Patient was instructed to call the office if he/she is having issues with the PAP device.   We  discussed good sleep hygiene.   Patient was advised not to engage in activities requiring concentration and/or vigilance if he/she is sleepy.  Patient was advised not to drive if he/she is sleepy.       Thank you very much for letting me participate in this patient's care. Please do not hesitate to give me a call if you have any questions or concerns regarding the treatment plan.   Patient will follow up with [place] after the sleep study.     Monica Becton, MD 12/13/2016   4:04 PM [facility] [place] Pager: [phone] Office: [phone], Fax: [phone]

## 2017-01-10 DIAGNOSIS — G4733 Obstructive sleep apnea (adult) (pediatric): Secondary | ICD-10-CM | POA: Diagnosis not present

## 2017-01-22 ENCOUNTER — Other Ambulatory Visit: Payer: Self-pay | Admitting: *Deleted

## 2017-01-22 ENCOUNTER — Telehealth: Payer: Self-pay | Admitting: Pulmonary Disease

## 2017-01-22 DIAGNOSIS — G4733 Obstructive sleep apnea (adult) (pediatric): Secondary | ICD-10-CM | POA: Diagnosis not present

## 2017-01-22 NOTE — Telephone Encounter (Signed)
Per RA, HST showed mild OSA. Does not recommend a CPAP machine.

## 2017-01-24 NOTE — Telephone Encounter (Signed)
Pt returning call for results can be reached on cell @ [phone].Lance Price

## 2017-01-24 NOTE — Telephone Encounter (Signed)
Left another message for patient to call back 

## 2017-01-24 NOTE — Telephone Encounter (Signed)
Patient returned phone call..ert ° °

## 2017-01-24 NOTE — Telephone Encounter (Signed)
Spoke with pt. He is aware of his sleep study results. Pt had an upcoming appointment with SG on 02/14/2017, he asked that I cancel this. OV has been canceled. Nothing further was needed.

## 2017-01-24 NOTE — Telephone Encounter (Signed)
Left message to call back for HST results.

## 2017-02-06 NOTE — Progress Notes (Signed)
Deloria Lair., MD [place] [address]  Polycythemia, secondary - Plan: CBC with Differential  Thrombocytopenia ([place]) - Plan: Pathologist smear review, Vitamin B12, Folate, Iron and TIBC, Ferritin, Rapid HIV screen (HIV 1/2 Ab+Ag), Hepatitis panel, acute, Copper, serum  CURRENT THERAPY: Observation  INTERVAL HISTORY: Lance Price 66 y.o. 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  He underwent his sleep study which demonstrated mild OSA with hypopneas causing oxygen desaturation.  He was initially managed at [facility] in [place], [place] with Dr. Jacquiline Doe AND Thrombocytopenia, stable, mild.  HPI Elements   Location: Blood  Quality: Polycythemia, secondary  Severity: Mild-moderate  Duration:   Context: OSA  Timing:   Modifying Factors: Negative JAK2 V617F/CALR/MPL/JAK2 exon 12-15 testing.  Associated Signs & Symptoms:    He reports having undergone regular phlebotomies at [facility] and he is wondering why we have not been.  The patient is provided education regarding secondary polycythemia versus polycythemia vera.  He is provided education regarding the management differences of each.  Review of Systems  Constitutional: Negative.  Negative for chills, fever and weight loss.  HENT: Negative.   Eyes: Negative.   Respiratory: Negative.  Negative for cough.   Cardiovascular: Negative.  Negative for chest pain.  Gastrointestinal: Negative.  Negative for blood in stool, constipation, diarrhea, melena, nausea and vomiting.  Genitourinary: Negative.   Musculoskeletal: Negative.   Skin: Negative.   Neurological: Negative.  Negative for weakness.  Endo/Heme/Allergies: Negative.   Psychiatric/Behavioral: Negative.     Past Medical History:  Diagnosis Date  . Gout   . Hypertension   . Polycythemia vera ([place])     History reviewed. No pertinent surgical  history.  History reviewed. No pertinent family history.  Social History   Social History  . Marital status: Married    Spouse name: N/A  . Number of children: N/A  . Years of education: N/A   Social History Main Topics  . Smoking status: Never Smoker  . Smokeless tobacco: Never Used  . Alcohol use No  . Drug use: No  . Sexual activity: Not Currently   Other Topics Concern  . None   Social History Narrative  . None     PHYSICAL EXAMINATION  ECOG PERFORMANCE STATUS: 0 - Asymptomatic  Vitals:   02/07/17 0957  BP: (!) 154/80  Pulse: (!) 58  Resp: 16    GENERAL:alert, no distress, well nourished, well developed, comfortable, cooperative, smiling and overweight, unaccompanied SKIN: skin color, texture, turgor are normal, no rashes or significant lesions HEAD: Normocephalic, No masses, lesions, tenderness or abnormalities EYES: normal, EOMI, Conjunctiva are pink and non-injected EARS: External ears normal OROPHARYNX:lips, buccal mucosa, and tongue normal and mucous membranes are moist  NECK: supple, no adenopathy, trachea midline LYMPH:  no palpable lymphadenopathy BREAST:not examined LUNGS: clear to auscultation and percussion HEART: regular rate & rhythm, no murmurs, no gallops, S1 normal and S2 normal ABDOMEN:abdomen soft, non-tender and normal bowel sounds BACK: Back symmetric, no curvature. EXTREMITIES:less then 2 second capillary refill, no joint deformities, effusion, or inflammation, no skin discoloration, no cyanosis  NEURO: alert & oriented x 3 with fluent speech, no focal motor/sensory deficits, gait normal    LABORATORY DATA: CBC    Component Value Date/Time   WBC 7.4 02/07/2017 0918   RBC 5.88 (H) 02/07/2017 0918   HGB 18.4 (H) 02/07/2017 0918   HCT 52.7 (  H) 02/07/2017 0918   PLT 135 (L) 02/07/2017 0918   MCV 89.6 02/07/2017 0918   MCH 31.3 02/07/2017 0918   MCHC 34.9 02/07/2017 0918   RDW 12.8 02/07/2017 0918   LYMPHSABS 1.9 02/07/2017 0918     MONOABS 0.6 02/07/2017 0918   EOSABS 0.4 02/07/2017 0918   BASOSABS 0.1 02/07/2017 0918      Chemistry      Component Value Date/Time   NA 140 02/07/2017 0918   K 4.1 02/07/2017 0918   CL 103 02/07/2017 0918   CO2 27 02/07/2017 0918   BUN 15 02/07/2017 0918   CREATININE 1.76 (H) 02/07/2017 0918      Component Value Date/Time   CALCIUM 9.4 02/07/2017 0918   ALKPHOS 62 02/07/2017 0918   AST 89 (H) 02/07/2017 0918   ALT 93 (H) 02/07/2017 0918   BILITOT 1.1 02/07/2017 0918        PENDING LABS:   RADIOGRAPHIC STUDIES:  No results found.   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  He underwent his sleep study which demonstrated mild OSA with hypopneas causing oxygen desaturation.  He was initially managed at [facility] in [place], [place] with Dr. Jacquiline Doe AND Thrombocytopenia, stable, mild.  Likely from fatty infiltration of liver.  Labs today: CBC diff, CMET.  I personally reviewed and went over laboratory results with the patient.  The results are noted within this dictation.  Labs in 4 months: CBC diff.  He is provided education regarding polycythemia vera versus secondary polycythemia and the differences in management.  I have reviewed his negative polycythemia vera work-up.  Will complete peripheral work-up for thrombocytopenia in 4 months with his next lab appointment.  Given its stability and the mild nature, no sense of urgency noted regarding work-up for thrombocytopenia.  Degrees of thrombocytopenia can be divided into mild (platelet count 100,000 to 150,000/microL), moderate (50,000 to 99,000/microL), and severe (<50,000/microL). Severe thrombocytopenia confers a greater risk of bleeding, but the correlation between platelet count and bleeding risk varies according to the underlying condition and may be unpredictable.  Asymptomatic,  incidental findings, mild thrombocytopenia is commonly caused by immune thrombocytopenia (ITP), occult liver disease, HIV infection, and myelodysplastic syndromes.  Congenital thrombocytopenic conditions, sometimes misdiagnosed as ITP, may also occur.  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  Anti-platelet antibody studies are not routinely done and imaging studies and bone marrow aspiration and biopsy are not necessary unless other abnormalities are present.  The natural history of asymptomatic, mild thrombocytopenia was studied prospectively in 217 apparently healthy individuals referred to a hematology center for incidentally discovered platelet counts between 100,000 and 150,000/microL [27]. At six months of observation, 23 (11 percent) had normal platelet counts, two developed a myelodysplastic syndrome (refractory anemia), and one developed systemic lupus erythematosus. The remaining 191 individuals (88 percent) had persistent platelet counts <150,000/microL during this period without other signs of disease becoming evident; after five years, most (63 percent) had spontaneous resolution or persistent mild thrombocytopenia without development of an associated condition, supporting a diagnosis of ITP.  Labs today: CBC diff, CMET, peripheral smear review, HIV screen (HIV 1/2 Ab+Ag), hepatitis C antibody [in addition to HCV RNA testing in those who have a greater likelihood of false negative  antibody testing (ie severely immunocompromised or hemodialysis patients or those suspected of having acute hepatitis C)], iron/TIBC, ferritin, vitamin B12, folate, serum copper.  If hepatitis C antibody is positive, then Hepatitis C RNA testing is indicated.  If HCV RNA is detected, the diagnosis of HCV  infection is confirmed.  If HCV RNA is not detected, then a reactive antibody likely represents either a past HCV infection that subsequently was cleared of a false-positive antibody test.  If HIV 1/2 immunoassay is positive, then HIV-1/HIV-2 antibody differentiation immunoassay is necessary to determine type of HIV infection.  If negative or indeterminate, then HIV RNA testing is needed to determine acute HIV1 infection versus negative for HIV.  Will provide therapeutic phlebotomy for HCT > 56% while maintaining HCT near 55%.  I have encouraged blood donation with the [employer] [facility].  Return in 4 months for follow-up.  ORDERS PLACED FOR THIS ENCOUNTER: Orders Placed This Encounter  Procedures  . CBC with Differential  . Pathologist smear review  . Vitamin B12  . Folate  . Iron and TIBC  . Ferritin  . Rapid HIV screen (HIV 1/2 Ab+Ag)  . Hepatitis panel, acute  . Copper, serum    MEDICATIONS PRESCRIBED THIS ENCOUNTER: No orders of the defined types were placed in this encounter.   THERAPY PLAN:  Ongoing observation with therapeutic phlebotomy for HCT >56%.  All questions were answered. The patient knows to call the clinic with any problems, questions or concerns. We can certainly see the patient much sooner if necessary.  Patient and plan discussed with Dr. Twana First and she is in agreement with the aforementioned.   This note is electronically signed by: Doy Mince 02/07/2017 1:07 PM

## 2017-02-06 NOTE — Assessment & Plan Note (Addendum)
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  He underwent his sleep study which demonstrated mild OSA with hypopneas causing oxygen desaturation.  He was initially managed at [facility] in [place], [place] with Dr. Jacquiline Doe AND Thrombocytopenia, stable, mild.  Likely from fatty infiltration of liver.  Labs today: CBC diff, CMET.  I personally reviewed and went over laboratory results with the patient.  The results are noted within this dictation.  Labs in 4 months: CBC diff.  He is provided education regarding polycythemia vera versus secondary polycythemia and the differences in management.  I have reviewed his negative polycythemia vera work-up.  Will complete peripheral work-up for thrombocytopenia in 4 months with his next lab appointment.  Given its stability and the mild nature, no sense of urgency noted regarding work-up for thrombocytopenia.  Degrees of thrombocytopenia can be divided into mild (platelet count 100,000 to 150,000/microL), moderate (50,000 to 99,000/microL), and severe (<50,000/microL). Severe thrombocytopenia confers a greater risk of bleeding, but the correlation between platelet count and bleeding risk varies according to the underlying condition and may be unpredictable.  Asymptomatic, incidental findings, mild thrombocytopenia is commonly caused by immune thrombocytopenia (ITP), occult liver disease, HIV infection, and myelodysplastic syndromes.  Congenital thrombocytopenic conditions, sometimes misdiagnosed as ITP, may also occur.  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  Anti-platelet antibody studies are not routinely done and imaging studies and bone marrow aspiration and biopsy are not necessary  unless other abnormalities are present.  The natural history of asymptomatic, mild thrombocytopenia was studied prospectively in 217 apparently healthy individuals referred to a hematology center for incidentally discovered platelet counts between 100,000 and 150,000/microL [27]. At six months of observation, 23 (11 percent) had normal platelet counts, two developed a myelodysplastic syndrome (refractory anemia), and one developed systemic lupus erythematosus. The remaining 191 individuals (88 percent) had persistent platelet counts <150,000/microL during this period without other signs of disease becoming evident; after five years, most (4 percent) had spontaneous resolution or persistent mild thrombocytopenia without development of an associated condition, supporting a diagnosis of ITP.  Labs today: CBC diff, CMET, peripheral smear review, HIV screen (HIV 1/2 Ab+Ag), hepatitis C antibody [in addition to HCV RNA testing in those who have a greater likelihood of false negative antibody testing (ie severely immunocompromised or hemodialysis patients or those suspected of having acute hepatitis C)], iron/TIBC, ferritin, vitamin B12, folate, serum copper.  If hepatitis C antibody is positive, then Hepatitis C RNA testing is indicated.  If HCV RNA is detected, the diagnosis of HCV infection is confirmed.  If HCV RNA is not detected, then a reactive antibody likely represents either a past HCV infection that subsequently was cleared of a false-positive antibody test.  If HIV 1/2 immunoassay is positive, then HIV-1/HIV-2 antibody differentiation immunoassay is necessary to determine type of HIV infection.  If negative or indeterminate, then HIV RNA testing is needed to determine acute HIV1 infection versus negative for HIV.  Will provide therapeutic phlebotomy for HCT > 56% while maintaining HCT near 55%.  I have encouraged blood donation with the [employer] [facility].  Return in 4 months for follow-up.

## 2017-02-07 ENCOUNTER — Encounter (HOSPITAL_COMMUNITY): Payer: Medicare Other

## 2017-02-07 ENCOUNTER — Encounter (HOSPITAL_COMMUNITY): Payer: Self-pay | Admitting: Oncology

## 2017-02-07 ENCOUNTER — Encounter (HOSPITAL_COMMUNITY): Payer: Medicare Other | Attending: Oncology | Admitting: Oncology

## 2017-02-07 VITALS — BP 154/80 | HR 58 | Resp 16 | Ht 66.0 in | Wt 183.7 lb

## 2017-02-07 DIAGNOSIS — D45 Polycythemia vera: Secondary | ICD-10-CM | POA: Insufficient documentation

## 2017-02-07 DIAGNOSIS — D751 Secondary polycythemia: Secondary | ICD-10-CM | POA: Diagnosis not present

## 2017-02-07 DIAGNOSIS — D696 Thrombocytopenia, unspecified: Secondary | ICD-10-CM

## 2017-02-07 LAB — CBC WITH DIFFERENTIAL/PLATELET
BASOS ABS: 0.1 10*3/uL (ref 0.0–0.1)
BASOS PCT: 1 %
EOS ABS: 0.4 10*3/uL (ref 0.0–0.7)
Eosinophils Relative: 6 %
HCT: 52.7 % — ABNORMAL HIGH (ref 39.0–52.0)
HEMOGLOBIN: 18.4 g/dL — AB (ref 13.0–17.0)
LYMPHS ABS: 1.9 10*3/uL (ref 0.7–4.0)
Lymphocytes Relative: 25 %
MCH: 31.3 pg (ref 26.0–34.0)
MCHC: 34.9 g/dL (ref 30.0–36.0)
MCV: 89.6 fL (ref 78.0–100.0)
Monocytes Absolute: 0.6 10*3/uL (ref 0.1–1.0)
Monocytes Relative: 8 %
NEUTROS PCT: 60 %
Neutro Abs: 4.5 10*3/uL (ref 1.7–7.7)
PLATELETS: 135 10*3/uL — AB (ref 150–400)
RBC: 5.88 MIL/uL — AB (ref 4.22–5.81)
RDW: 12.8 % (ref 11.5–15.5)
WBC: 7.4 10*3/uL (ref 4.0–10.5)

## 2017-02-07 LAB — COMPREHENSIVE METABOLIC PANEL
ALBUMIN: 4.2 g/dL (ref 3.5–5.0)
ALK PHOS: 62 U/L (ref 38–126)
ALT: 93 U/L — AB (ref 17–63)
AST: 89 U/L — AB (ref 15–41)
Anion gap: 10 (ref 5–15)
BUN: 15 mg/dL (ref 6–20)
CALCIUM: 9.4 mg/dL (ref 8.9–10.3)
CHLORIDE: 103 mmol/L (ref 101–111)
CO2: 27 mmol/L (ref 22–32)
CREATININE: 1.76 mg/dL — AB (ref 0.61–1.24)
GFR calc Af Amer: 44 mL/min — ABNORMAL LOW (ref >60)
GFR calc non Af Amer: 38 mL/min — ABNORMAL LOW (ref >60)
GLUCOSE: 116 mg/dL — AB (ref 65–99)
Potassium: 4.1 mmol/L (ref 3.5–5.1)
SODIUM: 140 mmol/L (ref 135–145)
Total Bilirubin: 1.1 mg/dL (ref 0.3–1.2)
Total Protein: 7.1 g/dL (ref 6.5–8.1)

## 2017-02-07 NOTE — Patient Instructions (Signed)
[facility] at [facility] Discharge Instructions  RECOMMENDATIONS MADE BY THE CONSULTANT AND ANY TEST RESULTS WILL BE SENT TO YOUR REFERRING PHYSICIAN.  You were seen today by Tom Kefalas PA-C. Return in 4 months for labs and follow up.    Thank you for choosing [facility] at [facility] to provide your oncology and hematology care.  To afford each patient quality time with our provider, please arrive at least 15 minutes before your scheduled appointment time.    If you have a lab appointment with the Cancer Center please come in thru the  Main Entrance and check in at the main information desk  You need to re-schedule your appointment should you arrive 10 or more minutes late.  We strive to give you quality time with our providers, and arriving late affects you and other patients whose appointments are after yours.  Also, if you no show three or more times for appointments you may be dismissed from the clinic at the providers discretion.     Again, thank you for choosing [facility].  Our hope is that these requests will decrease the amount of time that you wait before being seen by our physicians.       _____________________________________________________________  Should you have questions after your visit to [facility], please contact our office at [phone] between the hours of 8:30 a.m. and 4:30 p.m.  Voicemails left after 4:30 p.m. will not be returned until the following business day.  For prescription refill requests, have your pharmacy contact our office.       Resources For Cancer Patients and their Caregivers ? American Cancer Society: Can assist with transportation, wigs, general needs, runs Look Good Feel Better.        1-888-227-6333 ? Cancer Care: Provides financial assistance, online support groups, medication/co-pay assistance.  1-800-813-HOPE (4673) ? [facility] Assists [place] cancer patients and their families through emotional , educational and financial support.  336-427-4357 ? [place] DSS Where to apply for food stamps, Medicaid and utility assistance. 336-342-1394 ? [facility]: Transportation to medical appointments. 336-347-2287 ? Social Security Administration: May apply for disability if have a Stage IV cancer. 336-342-7796 1-800-772-1213 ? [place] [facility]ging, Disability and Transit Services: Assists with nutrition, care and transit needs. 336-349-2343  Cancer Center Support Programs: @10RELATIVEDAYS@ > Cancer Support Group  2nd Tuesday of the month 1pm-2pm, Journey Room  > Creative Journey  3rd Tuesday of the month 1130am-1pm, Journey Room  > Look Good Feel Better  1st Wednesday of the month 10am-12 noon, Journey Room (Call American Cancer Society to register 1-800-395-5775)    

## 2017-02-14 ENCOUNTER — Ambulatory Visit: Payer: Medicare Other | Admitting: Acute Care

## 2017-06-06 NOTE — Progress Notes (Signed)
[place], [place] [address]   CLINIC:  Medical Oncology/Hematology  PCP:  Deloria Lair., MD [place] [address] [phone]   REASON FOR VISIT:  Follow-up for Secondary polycythemia   CURRENT THERAPY: Therapeutic phlebotomy PRN    HISTORY OF PRESENT ILLNESS:  (From Dr. Laverle Patter last note on 09/27/16)         INTERVAL HISTORY:  Lance Price 66 y.o. male presents for routine follow-up for secondary polycythemia.  Overall, he tells me he has been feeling very well.  Appetite and energy levels both 100%. He continues to remain active playing golf and working part-time.  He will likely wrap up his part-time work within the next few months.    Denies any new concerns. Denies any chest pain, shortness of breath, headaches, or dizziness.  Denies any blood in his stools, hematuria, or abnormal bruising.  Reports that he has "a few drops" of blood from his nose ~3 days per week, "but it's not too bad at all and it stops on its own." He attributes those symptoms to allergies/"head cold."   He has not donated blood or had therapeutic phlebotomy in quite some time.  Reports that he had sleep study and had follow-up with pulmonologist. [name] tells me that he was diagnosed with sleep apnea, "but it was not that bad and he said I didn't need a CPAP machine."   For fun, he enjoys play golf and spending time with his grandchildren; granddaughter Lance Price (25 yo) and grandson Lance Price (23 yo). His only son and the grandchildren live outside of [address], [place].     REVIEW OF SYSTEMS:  Review of Systems  Constitutional: Negative.  Negative for chills, fatigue and fever.  HENT:   Positive for nosebleeds. Negative for lump/mass.   Eyes: Negative.   Respiratory: Negative.  Negative for cough and shortness of breath.   Cardiovascular: Negative.  Negative for chest pain and leg swelling.  Gastrointestinal: Negative.  Negative for  abdominal pain, blood in stool, constipation, diarrhea, nausea and vomiting.  Endocrine: Negative.   Genitourinary: Negative.  Negative for dysuria and hematuria.   Musculoskeletal: Negative.  Negative for arthralgias.  Skin: Negative.  Negative for rash.  Neurological: Negative.  Negative for dizziness and headaches.  Hematological: Negative.  Negative for adenopathy. Does not bruise/bleed easily.  Psychiatric/Behavioral: Negative.  Negative for depression and sleep disturbance. The patient is not nervous/anxious.      PAST MEDICAL/SURGICAL HISTORY:  Past Medical History:  Diagnosis Date  . Gout   . Hypertension   . Polycythemia vera ([place])    History reviewed. No pertinent surgical history.   SOCIAL HISTORY:  Social History   Socioeconomic History  . Marital status: Married    Spouse name: Not on file  . Number of children: Not on file  . Years of education: Not on file  . Highest education level: Not on file  Social Needs  . Financial resource strain: Not on file  . Food insecurity - worry: Not on file  . Food insecurity - inability: Not on file  . Transportation needs - medical: Not on file  . Transportation needs - non-medical: Not on file  Occupational History  . Not on file  Tobacco Use  . Smoking status: Never Smoker  . Smokeless tobacco: Never Used  Substance and Sexual Activity  . Alcohol use: No  . Drug use: No  . Sexual activity: Not Currently  Other  Topics Concern  . Not on file  Social History Narrative  . Not on file    FAMILY HISTORY:  History reviewed. No pertinent family history.  CURRENT MEDICATIONS:  Outpatient Encounter Medications as of 06/07/2017  Medication Sig  . allopurinol (ZYLOPRIM) 100 MG tablet Take by mouth.  Marland Kitchen aspirin 81 MG tablet Take 81 mg by mouth daily.  . metoprolol (LOPRESSOR) 50 MG tablet Take by mouth.  . triamterene-hydrochlorothiazide (DYAZIDE) 37.5-25 MG capsule Take by mouth.   No facility-administered encounter  medications on file as of 06/07/2017.     ALLERGIES:  No Known Allergies   PHYSICAL EXAM:  ECOG Performance status: 0 - Asymptomatic   Vitals:   06/07/17 1120  BP: (!) 145/82  Pulse: (!) 58  Resp: 18  Temp: 98 F (36.7 C)  SpO2: 98%   Filed Weights   06/07/17 1120  Weight: 181 lb 8 oz (82.3 kg)    Physical Exam  Constitutional: He is oriented to person, place, and time and well-developed, well-nourished, and in no distress.  HENT:  Head: Normocephalic.  Mouth/Throat: Oropharynx is clear and moist. No oropharyngeal exudate.  Eyes: Conjunctivae are normal. Pupils are equal, round, and reactive to light. No scleral icterus.  Neck: Normal range of motion. Neck supple.  Cardiovascular: Normal rate and regular rhythm.  Pulmonary/Chest: Effort normal and breath sounds normal. No respiratory distress.  Abdominal: Soft. Bowel sounds are normal. There is no tenderness.  Musculoskeletal: Normal range of motion. He exhibits no edema.  Lymphadenopathy:    He has no cervical adenopathy.       Right: No supraclavicular adenopathy present.       Left: No supraclavicular adenopathy present.  Neurological: He is alert and oriented to person, place, and time. No cranial nerve deficit. Gait normal.  Skin: Skin is warm and dry. No rash noted.  Psychiatric: Mood, memory, affect and judgment normal.  Nursing note and vitals reviewed.    LABORATORY DATA:  I have reviewed the labs as listed.  CBC    Component Value Date/Time   WBC 7.6 06/07/2017 1045   RBC 5.99 (H) 06/07/2017 1045   HGB 18.8 (H) 06/07/2017 1045   HCT 54.7 (H) 06/07/2017 1045   PLT 147 (L) 06/07/2017 1045   MCV 91.3 06/07/2017 1045   MCH 31.4 06/07/2017 1045   MCHC 34.4 06/07/2017 1045   RDW 12.7 06/07/2017 1045   LYMPHSABS 2.1 06/07/2017 1045   MONOABS 0.7 06/07/2017 1045   EOSABS 0.4 06/07/2017 1045   BASOSABS 0.1 06/07/2017 1045   CMP Latest Ref Rng & Units 02/07/2017 11/08/2016 09/27/2016  Glucose 65 - 99  mg/dL 116(H) 121(H) 113(H)  BUN 6 - 20 mg/dL 15 15 14   Creatinine 0.61 - 1.24 mg/dL 1.76(H) 1.52(H) 1.46(H)  Sodium 135 - 145 mmol/L 140 138 138  Potassium 3.5 - 5.1 mmol/L 4.1 3.7 3.6  Chloride 101 - 111 mmol/L 103 106 107  CO2 22 - 32 mmol/L 27 23 26   Calcium 8.9 - 10.3 mg/dL 9.4 9.3 9.2  Total Protein 6.5 - 8.1 g/dL 7.1 7.3 7.0  Total Bilirubin 0.3 - 1.2 mg/dL 1.1 0.8 0.8  Alkaline Phos 38 - 126 U/L 62 74 59  AST 15 - 41 U/L 89(H) 58(H) 48(H)  ALT 17 - 63 U/L 93(H) 71(H) 57        PENDING LABS:    DIAGNOSTIC IMAGING:  *The following diagnostic imaging was reviewed independently and agree with findings as listed below.  [place] abd: 11/01/16  CLINICAL DATA:  66 year old male with polycythemia vera. Thrombocytopenia. Evaluate liver and spleen.  EXAM: ABDOMEN ULTRASOUND COMPLETE  COMPARISON:  [facility] CT chest abdomen and pelvis 06/03/2013.  FINDINGS: Gallbladder: No gallstones or wall thickening visualized. No sonographic Murphy sign noted by sonographer.  Common bile duct: Diameter: 4 mm, normal  Liver: Mildly increased liver echogenicity compared to the right kidney. Overall liver size appears within normal limits. No discrete liver lesion. No intrahepatic biliary ductal dilatation.  IVC: No abnormality visualized.  Pancreas: Diminutive on the prior CT. Incompletely visualized due to overlying bowel gas, visualized portions within normal limits.  Spleen: Splenic size appears stable and within normal limits, splenic length 9.6 cm today. No discrete splenic lesion.  Right Kidney: Length: 12.3 cm. Chronic cortical volume loss. Echogenicity within normal limits. No mass or hydronephrosis visualized.  Left Kidney: Length: 12.3 cm. Chronic cortical volume loss. Small chronic and simple appearing 18 mm upper pole cortical cyst. Echogenicity within normal limits. No mass or hydronephrosis visualized.  Abdominal aorta: No aneurysm  visualized.  Other findings: None.  IMPRESSION: 1. Negative for hepatosplenomegaly. Mild hepatic steatosis suspected. No discrete liver or splenic lesion. 2. No acute findings in the abdomen.   Electronically Signed   By: Genevie Ann M.D.   On: 11/01/2016 09:10     PATHOLOGY:  CALR/JAK2 status: 09/27/16            ASSESSMENT & PLAN:   Secondary polycythemia/Thrombocytopenia:  -JAK2 & CALR negative. Polycythemia may be secondary to his obstructive sleep apnea (being managed by pulmonology).  -Labs reviewed in detail with patient. Hematocrit 54.7%; platelets 147,000.  The goal would be to maintain his Hct < 55%.  Ultrasound abd in 10/2016 was negative for hepatosplenomegaly with mild hepatic steatosis suspected at that time.  Very mild thrombocytopenia could be d/t fatty liver disease.  -Discussed with Dr. Talbert Cage. He does not tolerate therapeutic phlebotomy well; he was encouraged to donate blood with [employer] periodically to help with his polycythemia. We suspect that his secondary polycythemia may be d/t to mild sleep apnea.  It may be difficult to treat the underlying cause if his sleep apnea does not warrant use of CPAP.  He understands the increased risk of stroke/thrombolic events with persistent elevation in Hct.  -Several additional labs are pending for today for further evaluation including: serum copper, hepatitis panel, rapid HIV screen, anemia panel, and pathologist smear review.  We will review these labs when the results are available and contact him if we need to make any changes to his treatment plan.   -Return to cancer center in 4 months for follow-up with labs.  Kidney function:  -He expressed concern of elevated kidney function.  Review of his previous kidney function demonstrates evidence of slowly progressing chronic kidney disease.  He has seen Dr. Lowanda Foster with nephrology in the past, but he prefers to have [place] and his PCP monitor his blood work.   -I  explained to him that his PCP may be the best person to monitor his kidney function, as we generally monitor kidney function as it relates to anemia in our benign hematology patients.  I am certainly happy to periodically check CMET labs, but also encouraged him to follow-up with his PCP regarding these concerns as well.  He is in the process of looking for a new PCP, as his current PCP (Dr. Scotty Court) is planning to retire soon.  Therefore, I will plan to check CMET with his labs and  return visit in 4 months. He agrees with this plan.        Dispo:  -Return to cancer center in 4 months for follow-up with labs.    All questions were answered to patient's stated satisfaction. Encouraged patient to call with any new concerns or questions before his next visit to the cancer center and we can certain see him sooner, if needed.    Plan of care discussed with Dr. Talbert Cage, who agrees with the above aforementioned.      Orders placed this encounter:  Orders Placed This Encounter  Procedures  . CBC with Differential/Platelet  . Comprehensive metabolic panel      Mike Craze, NP [place] [phone]

## 2017-06-07 ENCOUNTER — Encounter (HOSPITAL_BASED_OUTPATIENT_CLINIC_OR_DEPARTMENT_OTHER): Payer: Medicare Other | Admitting: Adult Health

## 2017-06-07 ENCOUNTER — Other Ambulatory Visit: Payer: Self-pay

## 2017-06-07 ENCOUNTER — Encounter (HOSPITAL_COMMUNITY): Payer: Self-pay | Admitting: Adult Health

## 2017-06-07 ENCOUNTER — Encounter (HOSPITAL_COMMUNITY): Payer: Medicare Other | Attending: Oncology

## 2017-06-07 VITALS — BP 145/82 | HR 58 | Temp 98.0°F | Resp 18 | Wt 181.5 lb

## 2017-06-07 DIAGNOSIS — D751 Secondary polycythemia: Secondary | ICD-10-CM | POA: Insufficient documentation

## 2017-06-07 DIAGNOSIS — G4730 Sleep apnea, unspecified: Secondary | ICD-10-CM | POA: Diagnosis not present

## 2017-06-07 DIAGNOSIS — N189 Chronic kidney disease, unspecified: Secondary | ICD-10-CM

## 2017-06-07 DIAGNOSIS — D696 Thrombocytopenia, unspecified: Secondary | ICD-10-CM | POA: Diagnosis not present

## 2017-06-07 DIAGNOSIS — I129 Hypertensive chronic kidney disease with stage 1 through stage 4 chronic kidney disease, or unspecified chronic kidney disease: Secondary | ICD-10-CM | POA: Insufficient documentation

## 2017-06-07 DIAGNOSIS — Z7982 Long term (current) use of aspirin: Secondary | ICD-10-CM | POA: Insufficient documentation

## 2017-06-07 DIAGNOSIS — D6959 Other secondary thrombocytopenia: Secondary | ICD-10-CM

## 2017-06-07 DIAGNOSIS — Z79899 Other long term (current) drug therapy: Secondary | ICD-10-CM | POA: Diagnosis not present

## 2017-06-07 LAB — CBC WITH DIFFERENTIAL/PLATELET
BASOS ABS: 0.1 10*3/uL (ref 0.0–0.1)
BASOS PCT: 1 %
Eosinophils Absolute: 0.4 10*3/uL (ref 0.0–0.7)
Eosinophils Relative: 5 %
HEMATOCRIT: 54.7 % — AB (ref 39.0–52.0)
Hemoglobin: 18.8 g/dL — ABNORMAL HIGH (ref 13.0–17.0)
Lymphocytes Relative: 28 %
Lymphs Abs: 2.1 10*3/uL (ref 0.7–4.0)
MCH: 31.4 pg (ref 26.0–34.0)
MCHC: 34.4 g/dL (ref 30.0–36.0)
MCV: 91.3 fL (ref 78.0–100.0)
MONO ABS: 0.7 10*3/uL (ref 0.1–1.0)
Monocytes Relative: 9 %
NEUTROS ABS: 4.4 10*3/uL (ref 1.7–7.7)
NEUTROS PCT: 57 %
PLATELETS: 147 10*3/uL — AB (ref 150–400)
RBC: 5.99 MIL/uL — AB (ref 4.22–5.81)
RDW: 12.7 % (ref 11.5–15.5)
WBC: 7.6 10*3/uL (ref 4.0–10.5)

## 2017-06-07 LAB — IRON AND TIBC
IRON: 123 ug/dL (ref 45–182)
Saturation Ratios: 31 % (ref 17.9–39.5)
TIBC: 396 ug/dL (ref 250–450)
UIBC: 273 ug/dL

## 2017-06-07 LAB — RAPID HIV SCREEN (HIV 1/2 AB+AG)
HIV 1/2 Antibodies: NONREACTIVE
HIV-1 P24 Antigen - HIV24: NONREACTIVE

## 2017-06-07 LAB — VITAMIN B12: Vitamin B-12: 166 pg/mL — ABNORMAL LOW (ref 180–914)

## 2017-06-07 LAB — FOLATE: FOLATE: 21.4 ng/mL (ref >5.9)

## 2017-06-07 LAB — FERRITIN: Ferritin: 161 ng/mL (ref 24–336)

## 2017-06-07 NOTE — Patient Instructions (Addendum)
Lance Price at [facility] Discharge Instructions  RECOMMENDATIONS MADE BY THE CONSULTANT AND ANY TEST RESULTS WILL BE SENT TO YOUR REFERRING PHYSICIAN.  You were seen today by Mike Craze NP. Please donate blood when able.  Return in 4 months for labs and follow up.     Thank you for choosing [place] at [facility] to provide your oncology and hematology care.  To afford each patient quality time with our provider, please arrive at least 15 minutes before your scheduled appointment time.    If you have a lab appointment with the [facility] please come in thru the  Main Entrance and check in at the main information desk  You need to re-schedule your appointment should you arrive 10 or more minutes late.  We strive to give you quality time with our providers, and arriving late affects you and other patients whose appointments are after yours.  Also, if you no show three or more times for appointments you may be dismissed from the clinic at the providers discretion.     Again, thank you for choosing [facility].  Our hope is that these requests will decrease the amount of time that you wait before being seen by our physicians.       _____________________________________________________________  Should you have questions after your visit to [facility], please contact our office at [phone] between the hours of 8:30 a.m. and 4:30 p.m.  Voicemails left after 4:30 p.m. will not be returned until the following business day.  For prescription refill requests, have your pharmacy contact our office.       Resources For Cancer Patients and their Caregivers ? American Cancer Society: Can assist with transportation, wigs, general needs, runs Look Good Feel Better.        734-693-8645 ? Cancer Care: Provides financial assistance, online support groups, medication/co-pay assistance.  1-800-813-HOPE  6262919917) ? [facility] Assists [place] cancer patients and their families through emotional , educational and financial support.  601-344-3547 ? [place] DSS Where to apply for food stamps, Medicaid and utility assistance. 425-522-4058 ? [facility]: Transportation to medical appointments. (385) 133-4409 ? Social Security Administration: May apply for disability if have a Stage IV cancer. (310) 399-1242 9171070249 ? [employer], Disability and Transit Services: Assists with nutrition, care and transit needs. [facility] Support Programs: @10RELATIVEDAYS @ > Cancer Support Group  2nd Tuesday of the month 1pm-2pm, Journey Room  > Creative Journey  3rd Tuesday of the month 1130am-1pm, Journey Room  > Look Good Feel Better  1st Wednesday of the month 10am-12 noon, Journey Room (Call [place] to register [phone])

## 2017-06-08 LAB — HEPATITIS PANEL, ACUTE
HCV Ab: <0.1 s/co ratio (ref 0.0–0.9)
HEP A IGM: NEGATIVE
HEP B C IGM: NEGATIVE
Hepatitis B Surface Ag: NEGATIVE

## 2017-06-08 LAB — COPPER, SERUM: Copper: 83 ug/dL (ref 72–166)

## 2017-06-12 ENCOUNTER — Other Ambulatory Visit (HOSPITAL_COMMUNITY): Payer: Self-pay | Admitting: Adult Health

## 2017-06-12 DIAGNOSIS — E538 Deficiency of other specified B group vitamins: Secondary | ICD-10-CM

## 2017-06-12 MED ORDER — VITAMIN B-12 1000 MCG PO TABS: 1000.0000 ug | ORAL_TABLET | Freq: Every day | ORAL | 1 refills | Status: DC

## 2017-10-03 ENCOUNTER — Other Ambulatory Visit (HOSPITAL_COMMUNITY): Payer: Medicare Other

## 2017-10-03 ENCOUNTER — Ambulatory Visit (HOSPITAL_COMMUNITY): Payer: Medicare Other | Admitting: Internal Medicine

## 2017-10-24 ENCOUNTER — Inpatient Hospital Stay (HOSPITAL_COMMUNITY): Payer: Medicare Other | Attending: Internal Medicine | Admitting: Internal Medicine

## 2017-10-24 ENCOUNTER — Inpatient Hospital Stay (HOSPITAL_COMMUNITY): Payer: Medicare Other | Attending: Internal Medicine

## 2017-10-24 ENCOUNTER — Encounter (HOSPITAL_COMMUNITY): Payer: Self-pay | Admitting: Internal Medicine

## 2017-10-24 ENCOUNTER — Other Ambulatory Visit: Payer: Self-pay

## 2017-10-24 VITALS — BP 148/60 | HR 57 | Temp 98.2°F | Resp 16 | Ht 66.0 in | Wt 187.0 lb

## 2017-10-24 DIAGNOSIS — D751 Secondary polycythemia: Secondary | ICD-10-CM

## 2017-10-24 DIAGNOSIS — N289 Disorder of kidney and ureter, unspecified: Secondary | ICD-10-CM | POA: Diagnosis not present

## 2017-10-24 DIAGNOSIS — I10 Essential (primary) hypertension: Secondary | ICD-10-CM | POA: Diagnosis not present

## 2017-10-24 DIAGNOSIS — N189 Chronic kidney disease, unspecified: Secondary | ICD-10-CM

## 2017-10-24 DIAGNOSIS — D696 Thrombocytopenia, unspecified: Secondary | ICD-10-CM

## 2017-10-24 LAB — CBC WITH DIFFERENTIAL/PLATELET
BASOS PCT: 1 %
Basophils Absolute: 0 10*3/uL (ref 0.0–0.1)
Eosinophils Absolute: 0.4 10*3/uL (ref 0.0–0.7)
Eosinophils Relative: 6 %
HEMATOCRIT: 50.5 % (ref 39.0–52.0)
HEMOGLOBIN: 17 g/dL (ref 13.0–17.0)
LYMPHS PCT: 28 %
Lymphs Abs: 1.9 10*3/uL (ref 0.7–4.0)
MCH: 31 pg (ref 26.0–34.0)
MCHC: 33.7 g/dL (ref 30.0–36.0)
MCV: 92.2 fL (ref 78.0–100.0)
Monocytes Absolute: 0.5 10*3/uL (ref 0.1–1.0)
Monocytes Relative: 7 %
NEUTROS PCT: 58 %
Neutro Abs: 3.9 10*3/uL (ref 1.7–7.7)
Platelets: 119 10*3/uL — ABNORMAL LOW (ref 150–400)
RBC: 5.48 MIL/uL (ref 4.22–5.81)
RDW: 12.6 % (ref 11.5–15.5)
WBC: 6.7 10*3/uL (ref 4.0–10.5)

## 2017-10-24 LAB — COMPREHENSIVE METABOLIC PANEL
ALBUMIN: 3.8 g/dL (ref 3.5–5.0)
ALT: 70 U/L — AB (ref 17–63)
AST: 63 U/L — ABNORMAL HIGH (ref 15–41)
Alkaline Phosphatase: 73 U/L (ref 38–126)
Anion gap: 10 (ref 5–15)
BILIRUBIN TOTAL: 1 mg/dL (ref 0.3–1.2)
BUN: 14 mg/dL (ref 6–20)
CO2: 23 mmol/L (ref 22–32)
CREATININE: 1.58 mg/dL — AB (ref 0.61–1.24)
Calcium: 8.8 mg/dL — ABNORMAL LOW (ref 8.9–10.3)
Chloride: 106 mmol/L (ref 101–111)
GFR calc Af Amer: 50 mL/min — ABNORMAL LOW (ref >60)
GFR, EST NON AFRICAN AMERICAN: 43 mL/min — AB (ref >60)
GLUCOSE: 142 mg/dL — AB (ref 65–99)
Potassium: 3.9 mmol/L (ref 3.5–5.1)
Sodium: 139 mmol/L (ref 135–145)
TOTAL PROTEIN: 6.6 g/dL (ref 6.5–8.1)

## 2017-10-24 NOTE — Progress Notes (Signed)
Diagnosis Polycythemia, secondary - Plan: CBC with Differential/Platelet, Comprehensive metabolic panel, Lactate dehydrogenase, Ferritin, Vitamin B12, Iron and TIBC, Transferrin Saturation  Staging Cancer Staging No matching staging information was found for the patient.  Assessment and Plan:  1.  Secondary Polycythemia.   JAK2 & CALR negative.  Polycythemia was felt likely to be secondary to obstructive sleep apnea.  Goal is to maintain hematocrit less than 55.  Labs that were done 10/24/2017 showed a white count 6.7 hemoglobin 17 hematocrit 50.5 platelets 119,000.  Patient will remain on observation with no indication for therapeutic phlebotomy at the present time.  I have again discussed that for him to follow-up with primary care physician or pulmonary for further management of sleep apnea.  2.  Thrombocytopenia.  Platelet count is decreased at 119,000 today 11/03/2017.  He previously underwent abdominal ultrasound on November 01, 2016 that showed:   IMPRESSION: 1. Negative for hepatosplenomegaly. Mild hepatic steatosis suspected. No discrete liver or splenic lesion. 2. No acute findings in the abdomen.  Hepatitis and HIV labs were checked in November 2018 and were negative.  Review of labs dating back to 2018 have also shown evidence of mild thrombocytopenia.  He will return to clinic in 4 months for repeat lab evaluation.  3.  Elevated liver function tests.  AST is 63 ALT 70 on labs done 10/24/2017.  The patient had hepatitis and HIV labs done in November 2018 that were negative.  Abdominal ultrasound done November 01, 2016 showed fatty liver but no hepatosplenomegaly.  He reports he is not on cholesterol medications.  He will have repeat labs on return to clinic and should also continue to follow-up with his primary care physician.  Will check iron, TIBC, ferritin, and percent saturation on return to clinic to determine if any additional testing such as for hemochromatosis should be performed.  4.   Hypertension.  Blood pressure is 148/60.  Continue to follow-up with PCP.  5.  Sleep apnea.  Patient should continue to follow-up with PCP or pulmonary for management.  6.  Question regarding B12 deficiency.  Will recheck B12 levels on return to clinic.  The patient reports he is not currently on medications for this.  7.  Renal insufficiency.  Stable at 1.58 on today's labs.  He has been seen in the past by Dr. Lowanda Foster of nephrology.  He reports he continues to have lab monitoring now being done by his PCP.  He should continue follow-up as directed.  Interval History:  From Dr. Laverle Patter note of 11/08/2016:       Pt was followed by Dr. Talbert Cage for secondary polycythemia vera, JAK2 negative. Pt had abd US performed 11/01/2016 that showed fatty liver, but no HSM.    Current Status: Patient is seen today for follow-up to go over lab studies.  He was questioning why B12 was on his medication list as he reports he was not taking B12..  He also reported in the past he had been treated with frequent phlebotomies when he was being seen in [place].  He is due for colonoscopy.  Problem List Patient Active Problem List   Diagnosis Date Noted  . Thrombocytopenia ([place]) [D69.6] 02/07/2017  . Hypersomnia [G47.10] 12/13/2016  . Polycythemia, secondary [D75.1] 09/27/2016    Past Medical History Past Medical History:  Diagnosis Date  . Gout   . Hypertension   . Polycythemia vera ([place])     Past Surgical History History reviewed. No pertinent surgical history.  Family History History reviewed. No  pertinent family history.   Social History  reports that he has never smoked. He has never used smokeless tobacco. He reports that he does not drink alcohol or use drugs.  Medications  Current Outpatient Medications:  .  allopurinol (ZYLOPRIM) 100 MG tablet, Take by mouth., Disp: , Rfl:  .  aspirin 81 MG tablet, Take 81 mg by mouth daily., Disp: , Rfl:  .  metoprolol (LOPRESSOR) 50 MG tablet, Take by  mouth., Disp: , Rfl:  .  triamterene-hydrochlorothiazide (DYAZIDE) 37.5-25 MG capsule, Take by mouth., Disp: , Rfl:   Allergies Patient has no known allergies.  Review of Systems Review of Systems - Oncology ROS as per HPI otherwise 12 point ROS is negative.   Physical Exam  Vitals Wt Readings from Last 3 Encounters:  10/24/17 187 lb (84.8 kg)  06/07/17 181 lb 8 oz (82.3 kg)  02/07/17 183 lb 11.2 oz (83.3 kg)   Temp Readings from Last 3 Encounters:  10/24/17 98.2 F (36.8 C) (Oral)  06/07/17 98 F (36.7 C) (Oral)  11/08/16 98.6 F (37 C) (Oral)   BP Readings from Last 3 Encounters:  10/24/17 (!) 148/60  06/07/17 (!) 145/82  02/07/17 (!) 154/80   Pulse Readings from Last 3 Encounters:  10/24/17 (!) 57  06/07/17 (!) 58  02/07/17 (!) 58   Constitutional: Well-developed, well-nourished, and in no distress.   HENT: Head: Normocephalic and atraumatic.  Mouth/Throat: No oropharyngeal exudate. Mucosa moist. Eyes: Pupils are equal, round, and reactive to light. Conjunctivae are normal. No scleral icterus.  Neck: Normal range of motion. Neck supple. No JVD present.  Cardiovascular: Normal rate, regular rhythm and normal heart sounds.  Exam reveals no gallop and no friction rub.   No murmur heard. Pulmonary/Chest: Effort normal and breath sounds normal. No respiratory distress. No wheezes.No rales.  Abdominal: Soft. Bowel sounds are normal. No distension. There is no tenderness. There is no guarding.  Musculoskeletal: No edema or tenderness.  Lymphadenopathy: No cervical, axillary  or supraclavicular adenopathy.  Neurological: Alert and oriented to person, place, and time. No cranial nerve deficit.  Skin: Skin is warm and dry. No rash noted. No erythema. No pallor.  Psychiatric: Affect and judgment normal.   Labs Appointment on 10/24/2017  Component Date Value Ref Range Status  . WBC 10/24/2017 6.7  4.0 - 10.5 K/uL Final  . RBC 10/24/2017 5.48  4.22 - 5.81 MIL/uL Final    . Hemoglobin 10/24/2017 17.0  13.0 - 17.0 g/dL Final  . HCT 10/24/2017 50.5  39.0 - 52.0 % Final  . MCV 10/24/2017 92.2  78.0 - 100.0 fL Final  . MCH 10/24/2017 31.0  26.0 - 34.0 pg Final  . MCHC 10/24/2017 33.7  30.0 - 36.0 g/dL Final  . RDW 10/24/2017 12.6  11.5 - 15.5 % Final  . Platelets 10/24/2017 119* 150 - 400 K/uL Final   Comment: PLATELET COUNT CONFIRMED BY SMEAR SPECIMEN CHECKED FOR CLOTS   . Neutrophils Relative % 10/24/2017 58  % Final  . Neutro Abs 10/24/2017 3.9  1.7 - 7.7 K/uL Final  . Lymphocytes Relative 10/24/2017 28  % Final  . Lymphs Abs 10/24/2017 1.9  0.7 - 4.0 K/uL Final  . Monocytes Relative 10/24/2017 7  % Final  . Monocytes Absolute 10/24/2017 0.5  0.1 - 1.0 K/uL Final  . Eosinophils Relative 10/24/2017 6  % Final  . Eosinophils Absolute 10/24/2017 0.4  0.0 - 0.7 K/uL Final  . Basophils Relative 10/24/2017 1  % Final  .  Basophils Absolute 10/24/2017 0.0  0.0 - 0.1 K/uL Final   Performed at [facility], [address], [place], [place] [address]  . Sodium 10/24/2017 139  135 - 145 mmol/L Final  . Potassium 10/24/2017 3.9  3.5 - 5.1 mmol/L Final  . Chloride 10/24/2017 106  101 - 111 mmol/L Final  . CO2 10/24/2017 23  22 - 32 mmol/L Final  . Glucose, Bld 10/24/2017 142* 65 - 99 mg/dL Final  . BUN 10/24/2017 14  6 - 20 mg/dL Final  . Creatinine, Ser 10/24/2017 1.58* 0.61 - 1.24 mg/dL Final  . Calcium 10/24/2017 8.8* 8.9 - 10.3 mg/dL Final  . Total Protein 10/24/2017 6.6  6.5 - 8.1 g/dL Final  . Albumin 10/24/2017 3.8  3.5 - 5.0 g/dL Final  . AST 10/24/2017 63* 15 - 41 U/L Final  . ALT 10/24/2017 70* 17 - 63 U/L Final  . Alkaline Phosphatase 10/24/2017 73  38 - 126 U/L Final  . Total Bilirubin 10/24/2017 1.0  0.3 - 1.2 mg/dL Final  . GFR calc non Af Amer 10/24/2017 43* >60 mL/min Final  . GFR calc Af Amer 10/24/2017 50* >60 mL/min Final   Comment: (NOTE) The eGFR has been calculated using the CKD EPI equation. This calculation has not been validated in  all clinical situations. eGFR's persistently <60 mL/min signify possible Chronic Kidney Disease.   Georgiann Hahn gap 10/24/2017 10  5 - 15 Final   Performed at [facility], [address], [place], [place] [address]     Pathology Orders Placed This Encounter  Procedures  . CBC with Differential/Platelet    Standing Status:   Future    Standing Expiration Date:   10/25/2018  . Comprehensive metabolic panel    Standing Status:   Future    Standing Expiration Date:   10/25/2018  . Lactate dehydrogenase    Standing Status:   Future    Standing Expiration Date:   10/25/2018  . Ferritin    Standing Status:   Future    Standing Expiration Date:   10/25/2018  . Vitamin B12    Standing Status:   Future    Standing Expiration Date:   10/25/2018  . Iron and TIBC    Standing Status:   Future    Standing Expiration Date:   10/25/2018  . Transferrin Saturation    Standing Status:   Future    Standing Expiration Date:   10/25/2018       Zoila Shutter MD

## 2018-02-21 ENCOUNTER — Inpatient Hospital Stay (HOSPITAL_COMMUNITY): Payer: Medicare Other | Attending: Hematology

## 2018-02-21 DIAGNOSIS — R7989 Other specified abnormal findings of blood chemistry: Secondary | ICD-10-CM | POA: Diagnosis not present

## 2018-02-21 DIAGNOSIS — N281 Cyst of kidney, acquired: Secondary | ICD-10-CM | POA: Diagnosis not present

## 2018-02-21 DIAGNOSIS — I129 Hypertensive chronic kidney disease with stage 1 through stage 4 chronic kidney disease, or unspecified chronic kidney disease: Secondary | ICD-10-CM | POA: Insufficient documentation

## 2018-02-21 DIAGNOSIS — N189 Chronic kidney disease, unspecified: Secondary | ICD-10-CM | POA: Insufficient documentation

## 2018-02-21 DIAGNOSIS — D696 Thrombocytopenia, unspecified: Secondary | ICD-10-CM | POA: Diagnosis not present

## 2018-02-21 DIAGNOSIS — D751 Secondary polycythemia: Secondary | ICD-10-CM | POA: Insufficient documentation

## 2018-02-21 LAB — LACTATE DEHYDROGENASE: LDH: 181 U/L (ref 98–192)

## 2018-02-21 LAB — CBC WITH DIFFERENTIAL/PLATELET
BASOS ABS: 0.1 10*3/uL (ref 0.0–0.1)
Basophils Relative: 1 %
EOS PCT: 4 %
Eosinophils Absolute: 0.4 10*3/uL (ref 0.0–0.7)
HCT: 50.1 % (ref 39.0–52.0)
HEMOGLOBIN: 17.3 g/dL — AB (ref 13.0–17.0)
LYMPHS PCT: 31 %
Lymphs Abs: 2.7 10*3/uL (ref 0.7–4.0)
MCH: 31.7 pg (ref 26.0–34.0)
MCHC: 34.5 g/dL (ref 30.0–36.0)
MCV: 91.9 fL (ref 78.0–100.0)
Monocytes Absolute: 0.6 10*3/uL (ref 0.1–1.0)
Monocytes Relative: 7 %
NEUTROS ABS: 4.8 10*3/uL (ref 1.7–7.7)
NEUTROS PCT: 57 %
Platelets: 157 10*3/uL (ref 150–400)
RBC: 5.45 MIL/uL (ref 4.22–5.81)
RDW: 12.8 % (ref 11.5–15.5)
WBC: 8.6 10*3/uL (ref 4.0–10.5)

## 2018-02-21 LAB — COMPREHENSIVE METABOLIC PANEL
ALK PHOS: 65 U/L (ref 38–126)
ALT: 68 U/L — AB (ref 0–44)
AST: 61 U/L — AB (ref 15–41)
Albumin: 4 g/dL (ref 3.5–5.0)
Anion gap: 9 (ref 5–15)
BUN: 19 mg/dL (ref 8–23)
CO2: 25 mmol/L (ref 22–32)
Calcium: 8.9 mg/dL (ref 8.9–10.3)
Chloride: 106 mmol/L (ref 98–111)
Creatinine, Ser: 1.83 mg/dL — ABNORMAL HIGH (ref 0.61–1.24)
GFR calc Af Amer: 42 mL/min — ABNORMAL LOW (ref >60)
GFR, EST NON AFRICAN AMERICAN: 36 mL/min — AB (ref >60)
Glucose, Bld: 103 mg/dL — ABNORMAL HIGH (ref 70–99)
Potassium: 3.9 mmol/L (ref 3.5–5.1)
Sodium: 140 mmol/L (ref 135–145)
Total Bilirubin: 1.2 mg/dL (ref 0.3–1.2)
Total Protein: 6.7 g/dL (ref 6.5–8.1)

## 2018-02-21 LAB — IRON AND TIBC
Iron: 153 ug/dL (ref 45–182)
SATURATION RATIOS: 42 % — AB (ref 17.9–39.5)
TIBC: 368 ug/dL (ref 250–450)
UIBC: 215 ug/dL

## 2018-02-21 LAB — FERRITIN: Ferritin: 194 ng/mL (ref 24–336)

## 2018-02-21 LAB — VITAMIN B12: Vitamin B-12: 567 pg/mL (ref 180–914)

## 2018-02-26 DIAGNOSIS — R7301 Impaired fasting glucose: Secondary | ICD-10-CM | POA: Diagnosis not present

## 2018-02-26 DIAGNOSIS — Z0000 Encounter for general adult medical examination without abnormal findings: Secondary | ICD-10-CM | POA: Diagnosis not present

## 2018-02-26 DIAGNOSIS — Z125 Encounter for screening for malignant neoplasm of prostate: Secondary | ICD-10-CM | POA: Diagnosis not present

## 2018-02-26 DIAGNOSIS — Z136 Encounter for screening for cardiovascular disorders: Secondary | ICD-10-CM | POA: Diagnosis not present

## 2018-02-26 DIAGNOSIS — Z13220 Encounter for screening for lipoid disorders: Secondary | ICD-10-CM | POA: Diagnosis not present

## 2018-02-27 ENCOUNTER — Encounter (INDEPENDENT_AMBULATORY_CARE_PROVIDER_SITE_OTHER): Payer: Self-pay | Admitting: *Deleted

## 2018-02-28 ENCOUNTER — Inpatient Hospital Stay (HOSPITAL_BASED_OUTPATIENT_CLINIC_OR_DEPARTMENT_OTHER): Payer: Medicare Other | Admitting: Hematology

## 2018-02-28 ENCOUNTER — Encounter (HOSPITAL_COMMUNITY): Payer: Self-pay | Admitting: Hematology

## 2018-02-28 VITALS — BP 173/84 | HR 70 | Temp 98.4°F | Resp 16 | Wt 178.4 lb

## 2018-02-28 DIAGNOSIS — N281 Cyst of kidney, acquired: Secondary | ICD-10-CM | POA: Diagnosis not present

## 2018-02-28 DIAGNOSIS — D751 Secondary polycythemia: Secondary | ICD-10-CM | POA: Diagnosis not present

## 2018-02-28 DIAGNOSIS — N189 Chronic kidney disease, unspecified: Secondary | ICD-10-CM | POA: Diagnosis not present

## 2018-02-28 DIAGNOSIS — I129 Hypertensive chronic kidney disease with stage 1 through stage 4 chronic kidney disease, or unspecified chronic kidney disease: Secondary | ICD-10-CM | POA: Diagnosis not present

## 2018-02-28 DIAGNOSIS — R7989 Other specified abnormal findings of blood chemistry: Secondary | ICD-10-CM | POA: Diagnosis not present

## 2018-02-28 DIAGNOSIS — D696 Thrombocytopenia, unspecified: Secondary | ICD-10-CM

## 2018-02-28 NOTE — Assessment & Plan Note (Signed)
1.  Secondary polycythemia: - Negative for Jak 2 and other mutation testing.  Patient reports that he has a slight touch of obstructive sleep apnea but does not qualify for CPAP machine.  There is a cyst on his left kidney from ultrasound in 2018.  We will check a serum erythropoietin level. -he had received multiple phlebotomies at [facility] in [place] under the direction of Dr. Jacquiline Doe. -We reviewed the results of hematocrit which was 50.  Hemoglobin was 17.3.  He is completely asymptomatic.  Hence I did not recommend any phlebotomies at this time.  We will monitor it again in 4 months.  2.  Thrombocytopenia: -Ultrasound in April 2018 did not show any hepatosplenomegaly. - His platelet count today is within normal limits.  3.  Elevated LFTs: -Most likely from fatty liver disease.  The numbers look better than last few visits.  4.  CKD: -His creatinine has gradually gone up to 1.8.  I have recommended follow-up with Dr.  Lowanda Foster. -She takes metoprolol 100 mg once a day and Dyazide for his high blood pressure.

## 2018-02-28 NOTE — Progress Notes (Signed)
[facility] [address], [place] [address]   CLINIC:  Medical Oncology/Hematology  PCP:  Sandi Mealy, MD [place] [address] [phone]   REASON FOR VISIT:  Follow-up for secondary polycythemia, JAK2-, CALR-  CURRENT THERAPY: Observation   INTERVAL HISTORY:  Lance Price 67 y.o. male returns for routine follow-up for secondary polycythemia. Patient is here today and feeling good with no complaints at this time. He reports his appetite and energy levels are at 100%. He remains active. 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 He lives at home and is independent of needing helps. Patient denies any new pains. Denies any bleeding. Denies any headaches or visions changes.    REVIEW OF SYSTEMS:  Review of Systems  All other systems reviewed and are negative.    PAST MEDICAL/SURGICAL HISTORY:  Past Medical History:  Diagnosis Date  . Gout   . Hypertension   . Polycythemia vera ([place])    History reviewed. No pertinent surgical history.   SOCIAL HISTORY:  Social History   Socioeconomic History  . Marital status: Married    Spouse name: Not on file  . Number of children: Not on file  . Years of education: Not on file  . Highest education level: Not on file  Occupational History  . Not on file  Social Needs  . Financial resource strain: Not on file  . Food insecurity:    Worry: Not on file    Inability: Not on file  . Transportation needs:    Medical: Not on file    Non-medical: Not on file  Tobacco Use  . Smoking status: Never Smoker  . Smokeless tobacco: Never Used  Substance and Sexual Activity  . Alcohol use: No  . Drug use: No  . Sexual activity: Not Currently  Lifestyle  . Physical activity:    Days per week: Not on file    Minutes per session: Not on file  . Stress: Not on file  Relationships  . Social connections:    Talks on phone: Not on file    Gets together: Not on file     Attends religious service: Not on file    Active member of club or organization: Not on file    Attends meetings of clubs or organizations: Not on file    Relationship status: Not on file  . Intimate partner violence:    Fear of current or ex partner: Not on file    Emotionally abused: Not on file    Physically abused: Not on file    Forced sexual activity: Not on file  Other Topics Concern  . Not on file  Social History Narrative  . Not on file    FAMILY HISTORY:  History reviewed. No pertinent family history.  CURRENT MEDICATIONS:  Outpatient Encounter Medications as of 02/28/2018  Medication Sig  . allopurinol (ZYLOPRIM) 100 MG tablet Take by mouth.  Marland Kitchen aspirin 81 MG tablet Take 81 mg by mouth daily.  . metoprolol (LOPRESSOR) 50 MG tablet Take by mouth.  . triamterene-hydrochlorothiazide (DYAZIDE) 37.5-25 MG capsule Take by mouth.   No facility-administered encounter medications on file as of 02/28/2018.     ALLERGIES:  No Known Allergies   PHYSICAL EXAM:  ECOG Performance status: 1  Vital signs: BP: 123/71, P:70, R:16, TEMP:98.4, SATS:99% Weight 178.4  Physical Exam  Constitutional: He is oriented to person, place, and time. He  appears well-developed and well-nourished.  Cardiovascular: Normal rate, regular rhythm and normal heart sounds.  Pulmonary/Chest: Effort normal and breath sounds normal.  Abdominal: Soft.  Neurological: He is alert and oriented to person, place, and time.  Skin: Skin is warm and dry.     LABORATORY DATA:  I have reviewed the labs as listed.  CBC    Component Value Date/Time   WBC 8.6 02/21/2018 1542   RBC 5.45 02/21/2018 1542   HGB 17.3 (H) 02/21/2018 1542   HCT 50.1 02/21/2018 1542   PLT 157 02/21/2018 1542   MCV 91.9 02/21/2018 1542   MCH 31.7 02/21/2018 1542   MCHC 34.5 02/21/2018 1542   RDW 12.8 02/21/2018 1542   LYMPHSABS 2.7 02/21/2018 1542   MONOABS 0.6 02/21/2018 1542   EOSABS 0.4 02/21/2018 1542   BASOSABS 0.1  02/21/2018 1542   CMP Latest Ref Rng & Units 02/21/2018 10/24/2017 02/07/2017  Glucose 70 - 99 mg/dL 103(H) 142(H) 116(H)  BUN 8 - 23 mg/dL 19 14 15   Creatinine 0.61 - 1.24 mg/dL 1.83(H) 1.58(H) 1.76(H)  Sodium 135 - 145 mmol/L 140 139 140  Potassium 3.5 - 5.1 mmol/L 3.9 3.9 4.1  Chloride 98 - 111 mmol/L 106 106 103  CO2 22 - 32 mmol/L 25 23 27   Calcium 8.9 - 10.3 mg/dL 8.9 8.8(L) 9.4  Total Protein 6.5 - 8.1 g/dL 6.7 6.6 7.1  Total Bilirubin 0.3 - 1.2 mg/dL 1.2 1.0 1.1  Alkaline Phos 38 - 126 U/L 65 73 62  AST 15 - 41 U/L 61(H) 63(H) 89(H)  ALT 0 - 44 U/L 68(H) 70(H) 93(H)       DIAGNOSTIC IMAGING:  I have reviewed his ultrasound of the abdomen dated April 2018.     ASSESSMENT & PLAN:   Polycythemia, secondary 1.  Secondary polycythemia: - Negative for Jak 2 and other mutation testing.  Patient reports that he has a slight touch of obstructive sleep apnea but does not qualify for CPAP machine.  There is a cyst on his left kidney from ultrasound in 2018.  We will check a serum erythropoietin level. -he had received multiple phlebotomies at [facility] in [place] under the direction of Dr. Jacquiline Doe. -We reviewed the results of hematocrit which was 50.  Hemoglobin was 17.3.  He is completely asymptomatic.  Hence I did not recommend any phlebotomies at this time.  We will monitor it again in 4 months.  2.  Thrombocytopenia: -Ultrasound in April 2018 did not show any hepatosplenomegaly. - His platelet count today is within normal limits.  3.  Elevated LFTs: -Most likely from fatty liver disease.  The numbers look better than last few visits.  4.  CKD: -His creatinine has gradually gone up to 1.8.  I have recommended follow-up with Dr.  Lowanda Foster. -She takes metoprolol 100 mg once a day and Dyazide for his high blood pressure.      Orders placed this encounter:  Orders Placed This Encounter  Procedures  . Protein electrophoresis, serum  . CBC with Differential/Platelet    . Comprehensive metabolic panel  . Ferritin  . Iron and TIBC  . Erythropoietin      Lance Jack, MD [place] [phone]

## 2018-02-28 NOTE — Patient Instructions (Signed)
[place] at [facility] Discharge Instructions  Follow up with [place] in 4 months with labs 1 weeks prior.    Thank you for choosing [place] at [facility] to provide your oncology and hematology care.  To afford each patient quality time with our provider, please arrive at least 15 minutes before your scheduled appointment time.   If you have a lab appointment with the [facility] please come in thru the  Main Entrance and check in at the main information desk  You need to re-schedule your appointment should you arrive 10 or more minutes late.  We strive to give you quality time with our providers, and arriving late affects you and other patients whose appointments are after yours.  Also, if you no show three or more times for appointments you may be dismissed from the clinic at the providers discretion.     Again, thank you for choosing [facility].  Our hope is that these requests will decrease the amount of time that you wait before being seen by our physicians.       _____________________________________________________________  Should you have questions after your visit to [facility], please contact our office at [phone] between the hours of 8:00 a.m. and 4:30 p.m.  Voicemails left after 4:00 p.m. will not be returned until the following business day.  For prescription refill requests, have your pharmacy contact our office and allow 72 hours.    Cancer Center Support Programs:   > Cancer Support Group  2nd Tuesday of the month 1pm-2pm, Journey Room

## 2018-04-09 ENCOUNTER — Encounter (INDEPENDENT_AMBULATORY_CARE_PROVIDER_SITE_OTHER): Payer: Self-pay | Admitting: *Deleted

## 2018-05-27 ENCOUNTER — Encounter (INDEPENDENT_AMBULATORY_CARE_PROVIDER_SITE_OTHER): Payer: Self-pay | Admitting: *Deleted

## 2018-05-27 ENCOUNTER — Telehealth (INDEPENDENT_AMBULATORY_CARE_PROVIDER_SITE_OTHER): Payer: Self-pay | Admitting: *Deleted

## 2018-05-27 MED ORDER — SUPREP BOWEL PREP KIT 17.5-3.13-1.6 GM/177ML PO SOLN: 1.0000 | Freq: Once | ORAL | 0 refills | Status: AC

## 2018-05-27 NOTE — Telephone Encounter (Signed)
Patient needs suprep 

## 2018-06-10 DIAGNOSIS — M7581 Other shoulder lesions, right shoulder: Secondary | ICD-10-CM | POA: Diagnosis not present

## 2018-06-10 IMAGING — US US ABDOMEN COMPLETE
1 series · 13 of 25 positions shown · non-contrast
Comparison: [facility] [HOSPITAL] CT chest abdomen and pelvis
06/03/2013.

CLINICAL DATA: 66-year-old male with polycythemia Daniel.
Thrombocytopenia. Evaluate liver and spleen.

EXAM:
ABDOMEN ULTRASOUND COMPLETE

[Series 1: us abdomen complete · 0.24mm/px · 13 of 102 slices shown]
[im 1/102]
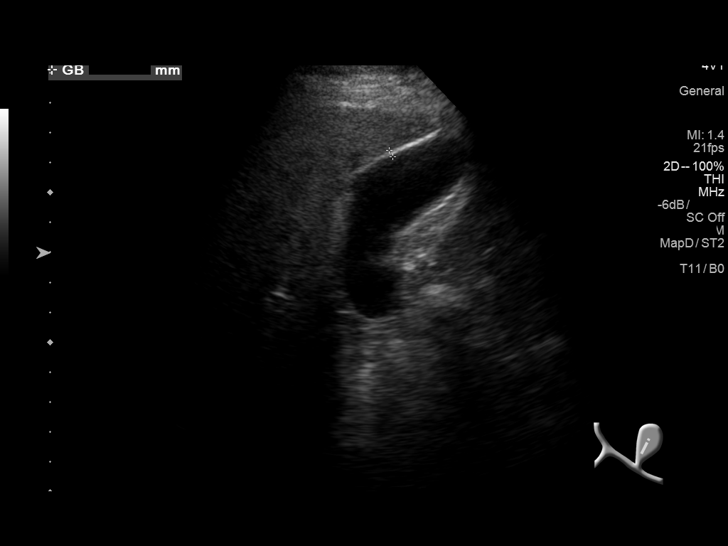
[im 9/102]
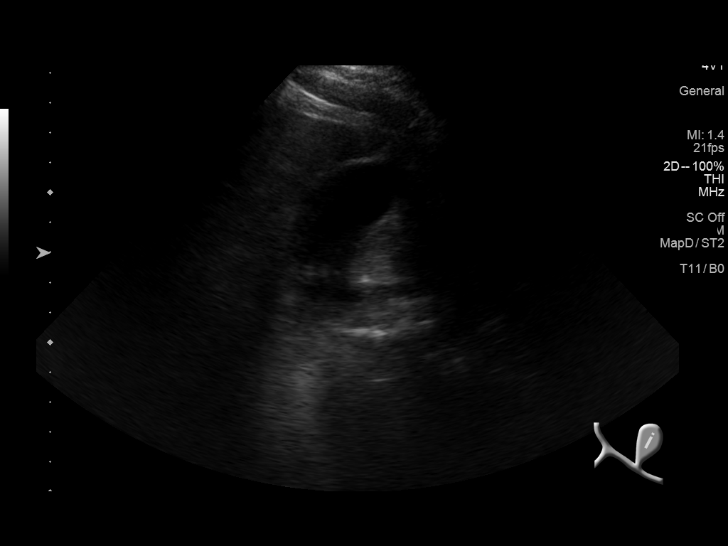
[im 17/102]
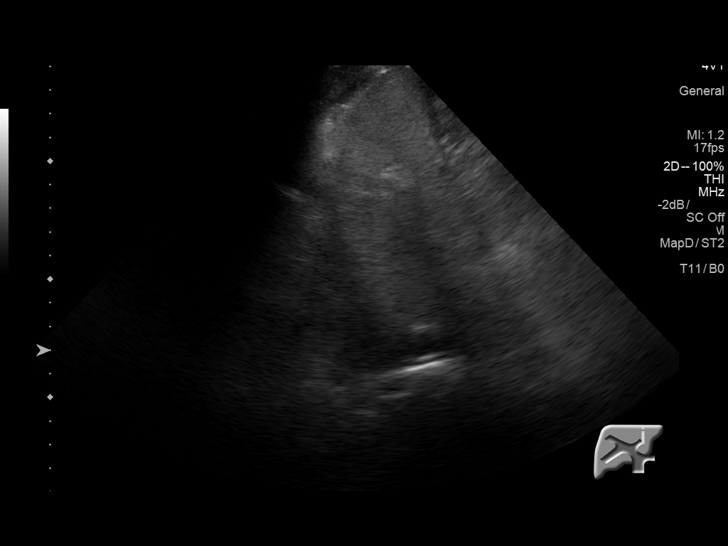
[im 26/102]
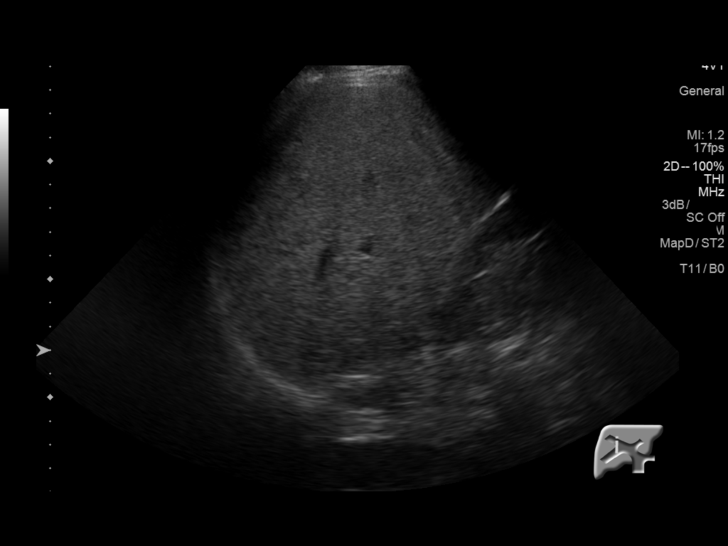
[im 34/102]
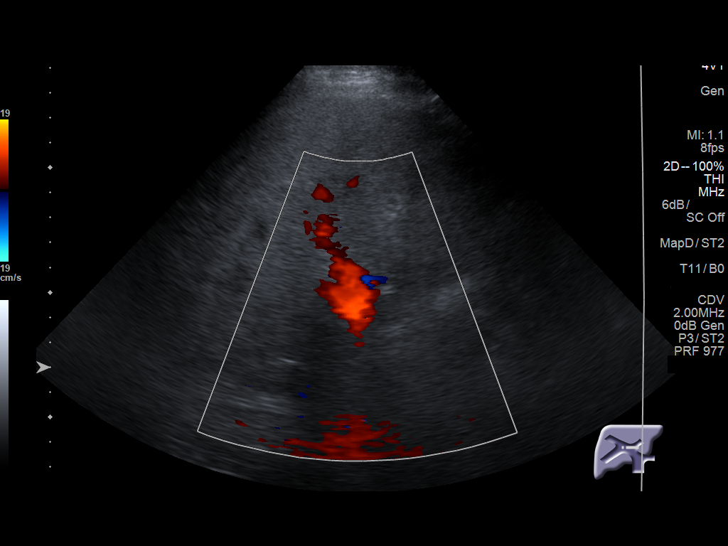
[im 43/102]
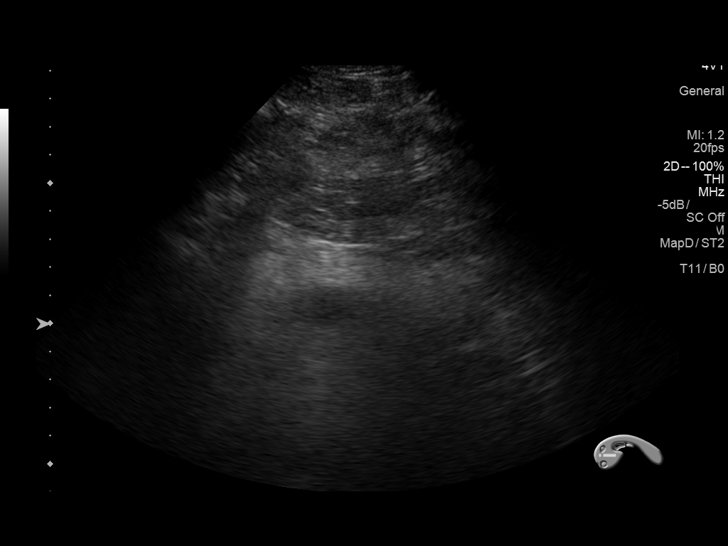
[im 51/102]
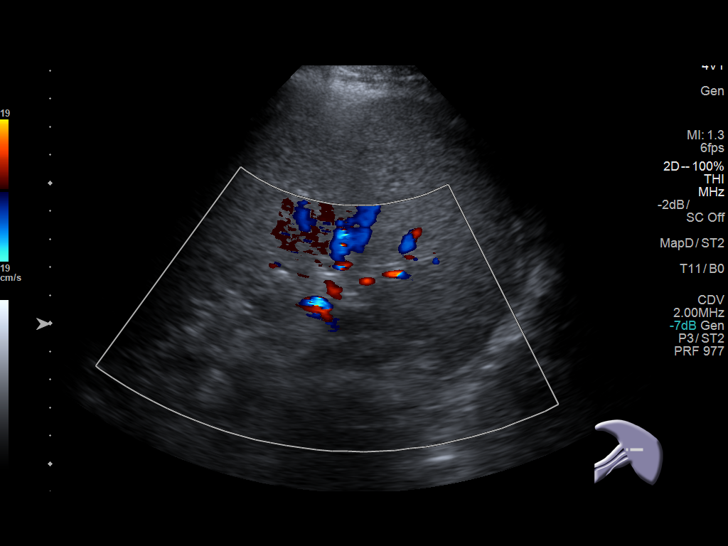
[im 59/102]
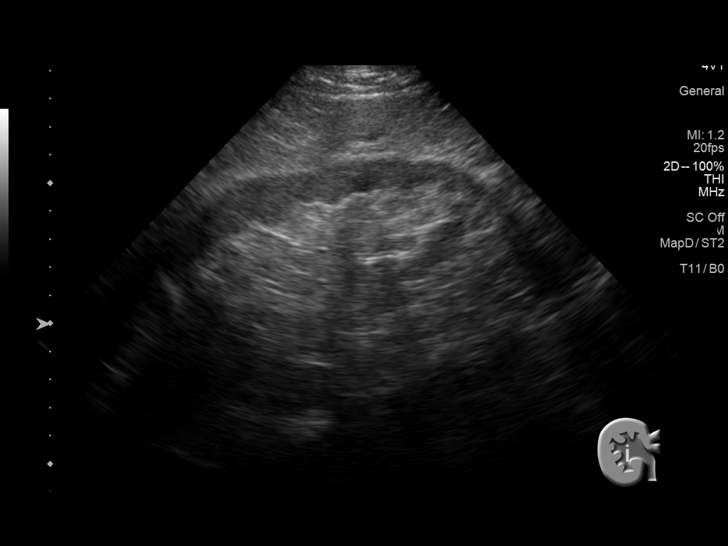
[im 68/102]
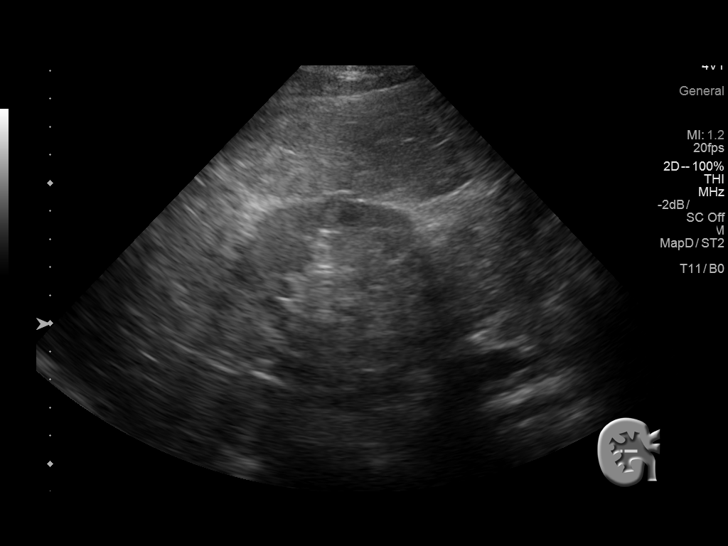
[im 76/102]
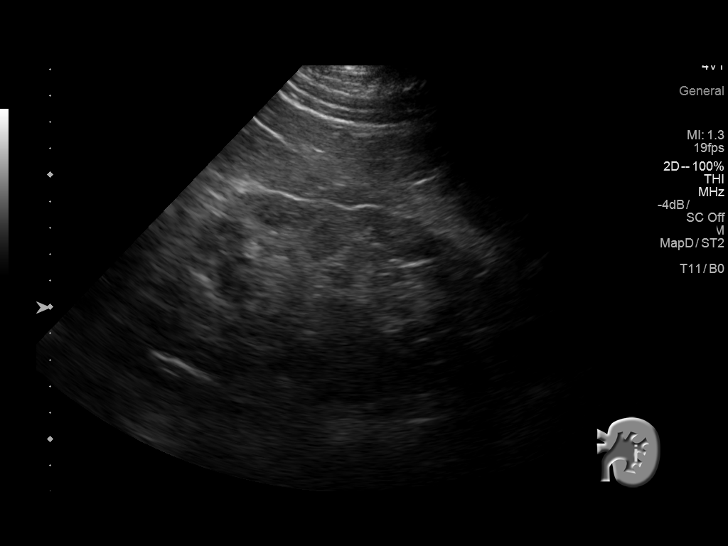
[im 85/102]
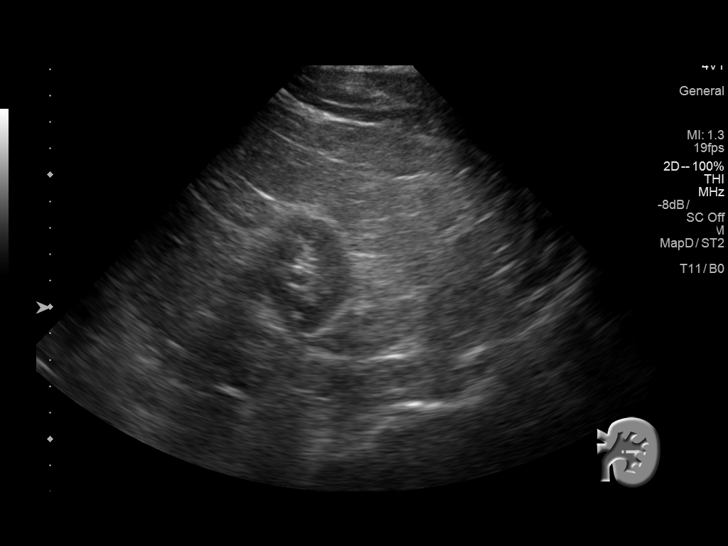
[im 93/102]
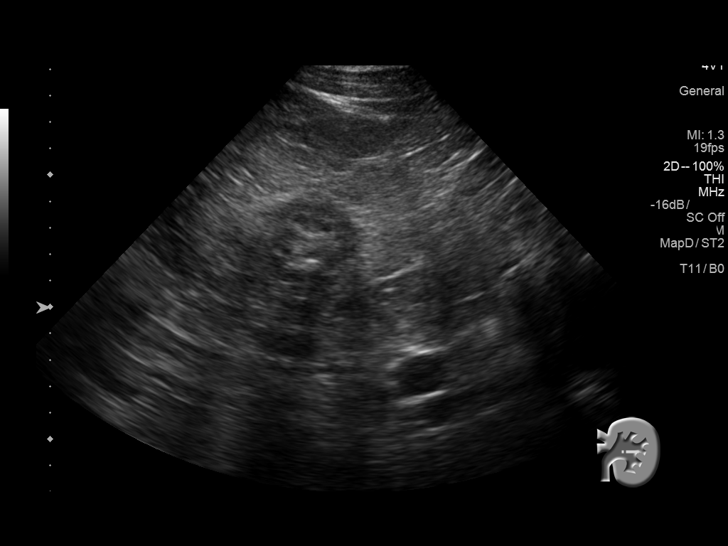
[im 102/102]
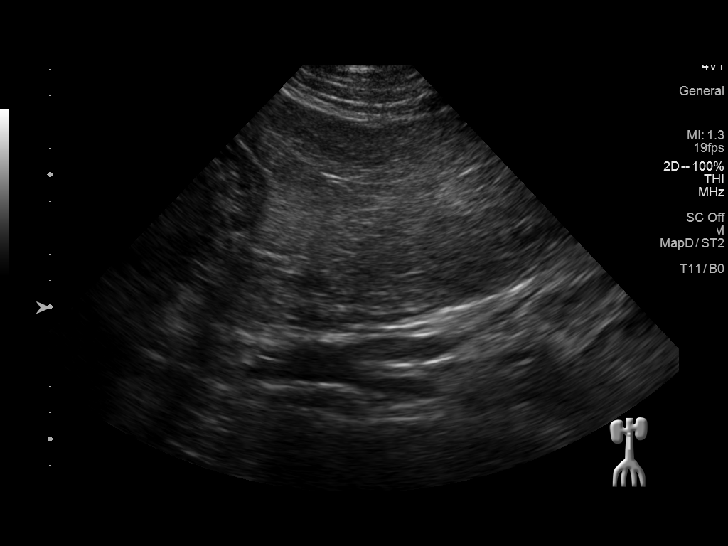

[13 of 25 positions shown; findings below may reference images not displayed]

FINDINGS: Gallbladder: No gallstones or wall thickening visualized. No
sonographic Murphy sign noted by sonographer.

Common bile duct: Diameter: 4 mm, normal

Liver: Mildly increased liver echogenicity compared to the right
kidney. Overall liver size appears within normal limits. No discrete
liver lesion. No intrahepatic biliary ductal dilatation.

IVC: No abnormality visualized.

Pancreas: Diminutive on the prior CT. Incompletely visualized due to
overlying bowel gas, visualized portions within normal limits.

Spleen: Splenic size appears stable and within normal limits,
splenic length 9.6 cm today. No discrete splenic lesion.

Right Kidney: Length: 12.3 cm. Chronic cortical volume loss.
Echogenicity within normal limits. No mass or hydronephrosis
visualized.

Left Kidney: Length: 12.3 cm. Chronic cortical volume loss. Small
chronic and simple appearing 18 mm upper pole cortical cyst.
Echogenicity within normal limits. No mass or hydronephrosis
visualized.

Abdominal aorta: No aneurysm visualized.

Other findings: None.
IMPRESSION: 1. Negative for hepatosplenomegaly. Mild hepatic steatosis
suspected. No discrete liver or splenic lesion.
2. No acute findings in the abdomen.

## 2018-06-13 ENCOUNTER — Telehealth (INDEPENDENT_AMBULATORY_CARE_PROVIDER_SITE_OTHER): Payer: Self-pay | Admitting: *Deleted

## 2018-06-13 NOTE — Telephone Encounter (Signed)
Referring MD/PCP: barrino   Procedure: tcs  Reason/Indication:  Hx polyps  Has patient had this procedure before?  Yes, 2011  If so, when, by whom and where?    Is there a family history of colon cancer?  no  Who?  What age when diagnosed?    Is patient diabetic?   no      Does patient have prosthetic heart valve or mechanical valve?  no  Do you have a pacemaker?  no  Has patient ever had endocarditis? no  Has patient had joint replacement within last 12 months?  no  Is patient constipated or do they take laxatives? no  Does patient have a history of alcohol/drug use?  no  Is patient on blood thinner such as Coumadin, Plavix and/or Aspirin? yes  Medications: asa 81 mg daily, allopurinol 100 mg bid, metoprolol 50 mg bid, triam/hctz 37.5/25 mg daily, atorvastatin 10 mg daily, bit b12 1000 mg daily  Allergies: nkda  Medication Adjustment per Dr Lindi Adie, NP: asa 2 days  Procedure date & time: 07/10/18 at 730

## 2018-06-16 NOTE — Telephone Encounter (Signed)
agree

## 2018-06-17 DIAGNOSIS — M7581 Other shoulder lesions, right shoulder: Secondary | ICD-10-CM | POA: Diagnosis not present

## 2018-06-17 DIAGNOSIS — M7541 Impingement syndrome of right shoulder: Secondary | ICD-10-CM | POA: Diagnosis not present

## 2018-07-01 ENCOUNTER — Inpatient Hospital Stay (HOSPITAL_COMMUNITY): Payer: Medicare Other | Attending: Hematology

## 2018-07-01 DIAGNOSIS — D751 Secondary polycythemia: Secondary | ICD-10-CM | POA: Diagnosis not present

## 2018-07-01 DIAGNOSIS — N189 Chronic kidney disease, unspecified: Secondary | ICD-10-CM | POA: Diagnosis not present

## 2018-07-01 DIAGNOSIS — Z7982 Long term (current) use of aspirin: Secondary | ICD-10-CM | POA: Diagnosis not present

## 2018-07-01 DIAGNOSIS — D696 Thrombocytopenia, unspecified: Secondary | ICD-10-CM | POA: Diagnosis not present

## 2018-07-01 DIAGNOSIS — I10 Essential (primary) hypertension: Secondary | ICD-10-CM | POA: Diagnosis not present

## 2018-07-01 DIAGNOSIS — Z79899 Other long term (current) drug therapy: Secondary | ICD-10-CM | POA: Diagnosis not present

## 2018-07-01 DIAGNOSIS — G4733 Obstructive sleep apnea (adult) (pediatric): Secondary | ICD-10-CM | POA: Diagnosis not present

## 2018-07-01 LAB — CBC WITH DIFFERENTIAL/PLATELET
ABS IMMATURE GRANULOCYTES: 0.01 10*3/uL (ref 0.00–0.07)
BASOS PCT: 1 %
Basophils Absolute: 0.1 10*3/uL (ref 0.0–0.1)
EOS ABS: 0.4 10*3/uL (ref 0.0–0.5)
Eosinophils Relative: 4 %
HCT: 51.5 % (ref 39.0–52.0)
Hemoglobin: 17 g/dL (ref 13.0–17.0)
Immature Granulocytes: 0 %
Lymphocytes Relative: 24 %
Lymphs Abs: 2 10*3/uL (ref 0.7–4.0)
MCH: 30 pg (ref 26.0–34.0)
MCHC: 33 g/dL (ref 30.0–36.0)
MCV: 91 fL (ref 80.0–100.0)
MONO ABS: 0.7 10*3/uL (ref 0.1–1.0)
Monocytes Relative: 8 %
NEUTROS ABS: 5.2 10*3/uL (ref 1.7–7.7)
Neutrophils Relative %: 63 %
PLATELETS: 157 10*3/uL (ref 150–400)
RBC: 5.66 MIL/uL (ref 4.22–5.81)
RDW: 12.1 % (ref 11.5–15.5)
WBC: 8.3 10*3/uL (ref 4.0–10.5)
nRBC: 0 % (ref 0.0–0.2)

## 2018-07-01 LAB — IRON AND TIBC
IRON: 80 ug/dL (ref 45–182)
SATURATION RATIOS: 19 % (ref 17.9–39.5)
TIBC: 432 ug/dL (ref 250–450)
UIBC: 352 ug/dL

## 2018-07-01 LAB — COMPREHENSIVE METABOLIC PANEL
ALT: 49 U/L — AB (ref 0–44)
ANION GAP: 7 (ref 5–15)
AST: 40 U/L (ref 15–41)
Albumin: 4 g/dL (ref 3.5–5.0)
Alkaline Phosphatase: 63 U/L (ref 38–126)
BUN: 18 mg/dL (ref 8–23)
CHLORIDE: 108 mmol/L (ref 98–111)
CO2: 23 mmol/L (ref 22–32)
CREATININE: 1.51 mg/dL — AB (ref 0.61–1.24)
Calcium: 9.1 mg/dL (ref 8.9–10.3)
GFR, EST AFRICAN AMERICAN: 54 mL/min — AB (ref >60)
GFR, EST NON AFRICAN AMERICAN: 46 mL/min — AB (ref >60)
Glucose, Bld: 126 mg/dL — ABNORMAL HIGH (ref 70–99)
POTASSIUM: 3.6 mmol/L (ref 3.5–5.1)
SODIUM: 138 mmol/L (ref 135–145)
Total Bilirubin: 1 mg/dL (ref 0.3–1.2)
Total Protein: 7 g/dL (ref 6.5–8.1)

## 2018-07-01 LAB — FERRITIN: FERRITIN: 33 ng/mL (ref 24–336)

## 2018-07-02 LAB — ERYTHROPOIETIN: ERYTHROPOIETIN: 19.9 m[IU]/mL — AB (ref 2.6–18.5)

## 2018-07-04 LAB — PROTEIN ELECTROPHORESIS, SERUM
A/G Ratio: 1.6 (ref 0.7–1.7)
Albumin ELP: 4 g/dL (ref 2.9–4.4)
Alpha-1-Globulin: 0.2 g/dL (ref 0.0–0.4)
Alpha-2-Globulin: 0.8 g/dL (ref 0.4–1.0)
Beta Globulin: 1 g/dL (ref 0.7–1.3)
GAMMA GLOBULIN: 0.5 g/dL (ref 0.4–1.8)
GLOBULIN, TOTAL: 2.5 g/dL (ref 2.2–3.9)
Total Protein ELP: 6.5 g/dL (ref 6.0–8.5)

## 2018-07-08 ENCOUNTER — Encounter (HOSPITAL_COMMUNITY): Payer: Self-pay | Admitting: Hematology

## 2018-07-08 ENCOUNTER — Inpatient Hospital Stay (HOSPITAL_BASED_OUTPATIENT_CLINIC_OR_DEPARTMENT_OTHER): Payer: Medicare Other | Admitting: Hematology

## 2018-07-08 VITALS — BP 129/70 | HR 64 | Temp 98.2°F | Resp 18 | Wt 172.9 lb

## 2018-07-08 DIAGNOSIS — G4733 Obstructive sleep apnea (adult) (pediatric): Secondary | ICD-10-CM

## 2018-07-08 DIAGNOSIS — D751 Secondary polycythemia: Secondary | ICD-10-CM

## 2018-07-08 DIAGNOSIS — D696 Thrombocytopenia, unspecified: Secondary | ICD-10-CM | POA: Diagnosis not present

## 2018-07-08 DIAGNOSIS — N289 Disorder of kidney and ureter, unspecified: Secondary | ICD-10-CM | POA: Diagnosis not present

## 2018-07-08 DIAGNOSIS — Z79899 Other long term (current) drug therapy: Secondary | ICD-10-CM | POA: Diagnosis not present

## 2018-07-08 DIAGNOSIS — Z7982 Long term (current) use of aspirin: Secondary | ICD-10-CM

## 2018-07-08 DIAGNOSIS — D45 Polycythemia vera: Secondary | ICD-10-CM

## 2018-07-08 DIAGNOSIS — N189 Chronic kidney disease, unspecified: Secondary | ICD-10-CM | POA: Diagnosis not present

## 2018-07-08 DIAGNOSIS — I10 Essential (primary) hypertension: Secondary | ICD-10-CM | POA: Diagnosis not present

## 2018-07-08 NOTE — Progress Notes (Signed)
[facility] [address] [place] [address]   CLINIC:  Medical Oncology/Hematology  PCP:  Sandi Mealy, MD [place] [address] [phone]   REASON FOR VISIT: Follow-up for secondary polycythemia, JAK2-, CALR-  CURRENT THERAPY: Observation   INTERVAL HISTORY:  Lance Price 67 y.o. male returns for routine follow-up for secondary polycythemia. He is doing well. He has had a recent head cold and has had a few nose bleeds but they stopped fast. He has no other complaints at this time. He has been diagnosed with OSA but it is mild and he does need a machine. He denies any new pains. Denies any easy bruising. Denies any numbness or tingling. Denies any headaches or fatigue. 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    REVIEW OF SYSTEMS:  Review of Systems  HENT:   Positive for nosebleeds.   All other systems reviewed and are negative.    PAST MEDICAL/SURGICAL HISTORY:  Past Medical History:  Diagnosis Date  . Gout   . Hypertension   . Polycythemia vera ([place])    History reviewed. No pertinent surgical history.   SOCIAL HISTORY:  Social History   Socioeconomic History  . Marital status: Married    Spouse name: Not on file  . Number of children: Not on file  . Years of education: Not on file  . Highest education level: Not on file  Occupational History  . Not on file  Social Needs  . Financial resource strain: Not on file  . Food insecurity:    Worry: Not on file    Inability: Not on file  . Transportation needs:    Medical: Not on file    Non-medical: Not on file  Tobacco Use  . Smoking status: Never Smoker  . Smokeless tobacco: Never Used  Substance and Sexual Activity  . Alcohol use: No  . Drug use: No  . Sexual activity: Not Currently  Lifestyle  . Physical activity:    Days per week: Not on file    Minutes per session: Not on file  . Stress: Not on file    Relationships  . Social connections:    Talks on phone: Not on file    Gets together: Not on file    Attends religious service: Not on file    Active member of club or organization: Not on file    Attends meetings of clubs or organizations: Not on file    Relationship status: Not on file  . Intimate partner violence:    Fear of current or ex partner: Not on file    Emotionally abused: Not on file    Physically abused: Not on file    Forced sexual activity: Not on file  Other Topics Concern  . Not on file  Social History Narrative  . Not on file    FAMILY HISTORY:  History reviewed. No pertinent family history.  CURRENT MEDICATIONS:  Outpatient Encounter Medications as of 07/08/2018  Medication Sig  . allopurinol (ZYLOPRIM) 100 MG tablet TAKE 2 TABLETS BY MOUTH  DAILY TO REDUCE URIC ACID  . atorvastatin (LIPITOR) 10 MG tablet Take by mouth.  . metoprolol tartrate (LOPRESSOR) 50 MG tablet TAKE 1 TABLET BY MOUTH  TWICE A DAY FOR BLOOD  PRESSURE  . triamterene-hydrochlorothiazide (MAXZIDE-25) 37.5-25 MG tablet TAKE 1 TABLET BY MOUTH  EVERY MORNING FOR FLUID AND BLOOD PRESSURE  .  allopurinol (ZYLOPRIM) 100 MG tablet Take by mouth.  Marland Kitchen aspirin 81 MG tablet Take 81 mg by mouth daily.  . diclofenac (CATAFLAM) 50 MG tablet Take 50 mg by mouth 2 (two) times daily as needed.  . diclofenac sodium (VOLTAREN) 1 % GEL APPLY 2 GRAMS TOPICALLY 4 TIMES DAILY  . metoprolol (LOPRESSOR) 50 MG tablet Take by mouth.  . triamterene-hydrochlorothiazide (DYAZIDE) 37.5-25 MG capsule Take by mouth.   No facility-administered encounter medications on file as of 07/08/2018.     ALLERGIES:  No Known Allergies   PHYSICAL EXAM:  ECOG Performance status: 1  Vitals:   07/08/18 0810  BP: 129/70  Pulse: 64  Resp: 18  Temp: 98.2 F (36.8 C)  SpO2: 98%   Filed Weights   07/08/18 0810  Weight: 172 lb 14.4 oz (78.4 kg)    Physical Exam Constitutional:      Appearance: Normal appearance. He is  normal weight.  Musculoskeletal: Normal range of motion.  Skin:    General: Skin is warm and dry.  Neurological:     Mental Status: He is alert and oriented to person, place, and time. Mental status is at baseline.  Psychiatric:        Mood and Affect: Mood normal.        Behavior: Behavior normal.        Thought Content: Thought content normal.        Judgment: Judgment normal.      LABORATORY DATA:  I have reviewed the labs as listed.  CBC    Component Value Date/Time   WBC 8.3 07/01/2018 0831   RBC 5.66 07/01/2018 0831   HGB 17.0 07/01/2018 0831   HCT 51.5 07/01/2018 0831   PLT 157 07/01/2018 0831   MCV 91.0 07/01/2018 0831   MCH 30.0 07/01/2018 0831   MCHC 33.0 07/01/2018 0831   RDW 12.1 07/01/2018 0831   LYMPHSABS 2.0 07/01/2018 0831   MONOABS 0.7 07/01/2018 0831   EOSABS 0.4 07/01/2018 0831   BASOSABS 0.1 07/01/2018 0831   CMP Latest Ref Rng & Units 07/01/2018 02/21/2018 10/24/2017  Glucose 70 - 99 mg/dL 126(H) 103(H) 142(H)  BUN 8 - 23 mg/dL 18 19 14   Creatinine 0.61 - 1.24 mg/dL 1.51(H) 1.83(H) 1.58(H)  Sodium 135 - 145 mmol/L 138 140 139  Potassium 3.5 - 5.1 mmol/L 3.6 3.9 3.9  Chloride 98 - 111 mmol/L 108 106 106  CO2 22 - 32 mmol/L 23 25 23   Calcium 8.9 - 10.3 mg/dL 9.1 8.9 8.8(L)  Total Protein 6.5 - 8.1 g/dL 7.0 6.7 6.6  Total Bilirubin 0.3 - 1.2 mg/dL 1.0 1.2 1.0  Alkaline Phos 38 - 126 U/L 63 65 73  AST 15 - 41 U/L 40 61(H) 63(H)  ALT 0 - 44 U/L 49(H) 68(H) 70(H)       DIAGNOSTIC IMAGING:  I have independently reviewed the scans and discussed with the patient.   I have reviewed Francene Finders, NP's note and agree with the documentation.  I personally performed a face-to-face visit, made revisions and my assessment and plan is as follows.    ASSESSMENT & PLAN:   Polycythemia, secondary 1.  Secondary polycythemia: - Negative for Jak 2 and other mutation testing.  Patient reports that he has a slight touch of obstructive sleep apnea but does not  qualify for CPAP machine.  There is a cyst on his left kidney from ultrasound in 2018.   -He had a history of multiple phlebotomies done at [facility]  in [place] under the direction of Dr. Versie Starks. - We reviewed his blood work today.  Hemoglobin is 17.  Erythropoietin is mildly elevated at 19.9. -He does not have any headaches or vision changes.  Does not feel excessive tiredness. - We will see him back in 4 months for follow-up and repeat blood counts.  2.  Thrombocytopenia: -Ultrasound in April 2018 did not show any hepatosplenomegaly. - Platelet count has been normal the last 2 times.  3.  Elevated LFTs: -Most likely from fatty liver disease.  Numbers have improved from prior visits.  4.  CKD: -His creatinine has stabilized around 1.5. -He takes metoprolol and Dyazide for high blood pressure.      Orders placed this encounter:  Orders Placed This Encounter  Procedures  . CBC with Differential/Platelet  . Comprehensive metabolic panel  . Ferritin  . Iron and TIBC  . Vitamin B12  . Folate  . Lactate dehydrogenase      Derek Jack, MD [place] [phone]

## 2018-07-08 NOTE — Patient Instructions (Signed)
[place] at [facility] Discharge Instructions  You were seen by Dr. Delton Coombes today   Thank you for choosing [place] at [facility] to provide your oncology and hematology care.  To afford each patient quality time with our provider, please arrive at least 15 minutes before your scheduled appointment time.   If you have a lab appointment with the [facility] please come in thru the  Main Entrance and check in at the main information desk  You need to re-schedule your appointment should you arrive 10 or more minutes late.  We strive to give you quality time with our providers, and arriving late affects you and other patients whose appointments are after yours.  Also, if you no show three or more times for appointments you may be dismissed from the clinic at the providers discretion.     Again, thank you for choosing [facility].  Our hope is that these requests will decrease the amount of time that you wait before being seen by our physicians.       _____________________________________________________________  Should you have questions after your visit to [facility], please contact our office at [phone] between the hours of 8:00 a.m. and 4:30 p.m.  Voicemails left after 4:00 p.m. will not be returned until the following business day.  For prescription refill requests, have your pharmacy contact our office and allow 72 hours.    Cancer Center Support Programs:   > Cancer Support Group  2nd Tuesday of the month 1pm-2pm, Journey Room

## 2018-07-08 NOTE — Assessment & Plan Note (Signed)
1.  Secondary polycythemia: - Negative for Jak 2 and other mutation testing.  Patient reports that he has a slight touch of obstructive sleep apnea but does not qualify for CPAP machine.  There is a cyst on his left kidney from ultrasound in 2018.   -He had a history of multiple phlebotomies done at [facility] in [place] under the direction of Dr. Versie Starks. - We reviewed his blood work today.  Hemoglobin is 17.  Erythropoietin is mildly elevated at 19.9. -He does not have any headaches or vision changes.  Does not feel excessive tiredness. - We will see him back in 4 months for follow-up and repeat blood counts.  2.  Thrombocytopenia: -Ultrasound in April 2018 did not show any hepatosplenomegaly. - Platelet count has been normal the last 2 times.  3.  Elevated LFTs: -Most likely from fatty liver disease.  Numbers have improved from prior visits.  4.  CKD: -His creatinine has stabilized around 1.5. -He takes metoprolol and Dyazide for high blood pressure.

## 2018-07-10 ENCOUNTER — Encounter (HOSPITAL_COMMUNITY): Payer: Self-pay | Admitting: *Deleted

## 2018-07-10 ENCOUNTER — Other Ambulatory Visit: Payer: Self-pay

## 2018-07-10 ENCOUNTER — Ambulatory Visit (HOSPITAL_COMMUNITY)
Admission: RE | Admit: 2018-07-10 | Discharge: 2018-07-10 | Disposition: A | Discharge status: Home / Self Care | Payer: Medicare Other | Source: Ambulatory Visit | Attending: Internal Medicine | Admitting: Internal Medicine

## 2018-07-10 ENCOUNTER — Encounter (HOSPITAL_COMMUNITY)
Admission: RE | Disposition: A | Discharge status: Home / Self Care | Payer: Self-pay | Source: Ambulatory Visit | Attending: Internal Medicine

## 2018-07-10 DIAGNOSIS — K644 Residual hemorrhoidal skin tags: Secondary | ICD-10-CM | POA: Diagnosis not present

## 2018-07-10 DIAGNOSIS — M109 Gout, unspecified: Secondary | ICD-10-CM | POA: Insufficient documentation

## 2018-07-10 DIAGNOSIS — I10 Essential (primary) hypertension: Secondary | ICD-10-CM | POA: Insufficient documentation

## 2018-07-10 DIAGNOSIS — K5730 Diverticulosis of large intestine without perforation or abscess without bleeding: Secondary | ICD-10-CM | POA: Insufficient documentation

## 2018-07-10 DIAGNOSIS — Z79899 Other long term (current) drug therapy: Secondary | ICD-10-CM | POA: Insufficient documentation

## 2018-07-10 DIAGNOSIS — Z1211 Encounter for screening for malignant neoplasm of colon: Secondary | ICD-10-CM | POA: Diagnosis not present

## 2018-07-10 DIAGNOSIS — Z09 Encounter for follow-up examination after completed treatment for conditions other than malignant neoplasm: Secondary | ICD-10-CM | POA: Diagnosis not present

## 2018-07-10 DIAGNOSIS — Z86010 Personal history of colonic polyps: Secondary | ICD-10-CM | POA: Diagnosis not present

## 2018-07-10 DIAGNOSIS — Z7982 Long term (current) use of aspirin: Secondary | ICD-10-CM | POA: Diagnosis not present

## 2018-07-10 HISTORY — PX: COLONOSCOPY: SHX5424

## 2018-07-10 SURGERY — COLONOSCOPY
Anesthesia: Moderate Sedation

## 2018-07-10 MED ORDER — MEPERIDINE HCL 50 MG/ML IJ SOLN
INTRAMUSCULAR | Status: DC | PRN
  Administered 2018-07-10 (×2): 25 mg via INTRAVENOUS

## 2018-07-10 MED ORDER — MIDAZOLAM HCL 5 MG/5ML IJ SOLN
INTRAMUSCULAR | Status: AC
  Filled 2018-07-10: qty 10

## 2018-07-10 MED ORDER — STERILE WATER FOR IRRIGATION IR SOLN
Status: DC | PRN
  Administered 2018-07-10: 100 mL

## 2018-07-10 MED ORDER — MEPERIDINE HCL 50 MG/ML IJ SOLN
INTRAMUSCULAR | Status: AC
  Filled 2018-07-10: qty 1

## 2018-07-10 MED ORDER — MIDAZOLAM HCL 5 MG/5ML IJ SOLN
INTRAMUSCULAR | Status: DC | PRN
  Administered 2018-07-10 (×2): 2 mg via INTRAVENOUS

## 2018-07-10 MED ORDER — SODIUM CHLORIDE 0.9 % IV SOLN
INTRAVENOUS | Status: DC
  Administered 2018-07-10: 1000 mL via INTRAVENOUS

## 2018-07-10 NOTE — Op Note (Signed)
[facility] Patient Name: Lance Price Procedure Date: 07/10/2018 7:07 AM MRN: [id number] Date of Birth: 18-Dec-1950 Attending MD: Hildred Laser , MD CSN: [id number] Age: 67 Admit Type: Outpatient Procedure:                Colonoscopy Indications:              High risk colon cancer surveillance: Personal                            history of colonic polyps Providers:                Hildred Laser, MD, Rosina Lowenstein, RN, Aram Candela Referring MD:             Chapman Fitch, MD Medicines:                Meperidine 50 mg IV, Midazolam 4 mg IV Complications:            No immediate complications. Estimated Blood Loss:     Estimated blood loss: none. Procedure:                Pre-Anesthesia Assessment:                           - Prior to the procedure, a History and Physical                            was performed, and patient medications and                            allergies were reviewed. The patient's tolerance of                            previous anesthesia was also reviewed. The risks                            and benefits of the procedure and the sedation                            options and risks were discussed with the patient.                            All questions were answered, and informed consent                            was obtained. Prior Anticoagulants: The patient                            last took aspirin 3 days prior to the procedure.                            ASA Grade Assessment: II - A patient with mild                            systemic disease. After reviewing the risks and  benefits, the patient was deemed in satisfactory                            condition to undergo the procedure.                           After obtaining informed consent, the colonoscope                            was passed under direct vision. Throughout the                            procedure, the patient's blood pressure, pulse, and                          oxygen saturations were monitored continuously. The                            [id number]-[id number] ([id number]) scope was introduced through                            the anus and advanced to the the terminal ileum,                            with identification of the appendiceal orifice and                            IC valve. The colonoscopy was performed without                            difficulty. The patient tolerated the procedure                            well. The quality of the bowel preparation was                            good. The ileocecal valve, appendiceal orifice, and                            rectum were photographed. Scope In: 7:43:28 AM Scope Out: 7:59:54 AM Scope Withdrawal Time: 0 hours 13 minutes 38 seconds  Total Procedure Duration: 0 hours 16 minutes 26 seconds  Findings:      The perianal and digital rectal examinations were normal.      The terminal ileum appeared normal.      Scattered diverticula were found in the sigmoid colon.      Linear scarprevious surgery was found in the distal rectum. The scar       tissue was healthy in appearance.      External hemorrhoids were found during retroflexion. The hemorrhoids       were small. Impression:               - The examined portion of the ileum was normal.                           -  Diverticulosis in the sigmoid colon.                           - External hemorrhoids.                           - No specimens collected. Moderate Sedation:      Moderate (conscious) sedation was administered by the endoscopy nurse       and supervised by the endoscopist. The following parameters were       monitored: oxygen saturation, heart rate, blood pressure, CO2       capnography and response to care. Total physician intraservice time was       23 minutes. Recommendation:           - Patient has a contact number available for                            emergencies. The signs and symptoms of potential                             delayed complications were discussed with the                            patient. Return to normal activities tomorrow.                            Written discharge instructions were provided to the                            patient.                           - High fiber diet today.                           - Continue present medications.                           - Repeat colonoscopy in 7 years for surveillance. Procedure Code(s):        --- Professional ---                           [phone], Colonoscopy, flexible; diagnostic, including                            collection of specimen(s) by brushing or washing,                            when performed (separate procedure)                           99153, Moderate sedation; each additional 15                            minutes intraservice time  G0500, Moderate sedation services provided by the                            same physician or other qualified health care                            professional performing a gastrointestinal                            endoscopic service that sedation supports,                            requiring the presence of an independent trained                            observer to assist in the monitoring of the                            patient's level of consciousness and physiological                            status; initial 15 minutes of intra-service time;                            patient age 1 years or older (additional time may                            be reported with [phone], as appropriate) Diagnosis Code(s):        --- Professional ---                           Z86.010, Personal history of colonic polyps                           K64.4, Residual hemorrhoidal skin tags                           K57.30, Diverticulosis of large intestine without                            perforation or abscess without bleeding CPT copyright 2018 American  Medical Association. All rights reserved. The codes documented in this report are preliminary and upon coder review may  be revised to meet current compliance requirements. Hildred Laser, MD Hildred Laser, MD 07/10/2018 8:13:42 AM This report has been signed electronically. Number of Addenda: 0

## 2018-07-10 NOTE — H&P (Signed)
Lance Price is an 67 y.o. male.   Chief Complaint: Patient is here for colonoscopy. HPI: Patient is 67 year old Caucasian male who is here for surveillance colonoscopy.  He had an exam in 2011 with removal of small tubular adenoma.  He was advised to come back in 7 years.  He has no GI symptoms.  He denies abdominal pain change in bowel habits or rectal bleeding. Last aspirin dose was 3 days ago. While family history is negative for CRC in his father had multiple polyps.   Past Medical History:  Diagnosis Date  . Gout   . Hypertension   . Polycythemia vera ([place])     History reviewed. No pertinent surgical history.  History reviewed. No pertinent family history. Social History:  reports that he has never smoked. He has never used smokeless tobacco. He reports that he does not drink alcohol or use drugs.  Allergies: No Known Allergies  Medications Prior to Admission  Medication Sig Dispense Refill  . allopurinol (ZYLOPRIM) 100 MG tablet Take by mouth.    Marland Kitchen allopurinol (ZYLOPRIM) 100 MG tablet TAKE 2 TABLETS BY MOUTH  DAILY TO REDUCE URIC ACID    . atorvastatin (LIPITOR) 10 MG tablet Take by mouth.    . diclofenac (CATAFLAM) 50 MG tablet Take 50 mg by mouth 2 (two) times daily as needed.  1  . diclofenac sodium (VOLTAREN) 1 % GEL APPLY 2 GRAMS TOPICALLY 4 TIMES DAILY  2  . metoprolol (LOPRESSOR) 50 MG tablet Take by mouth.    . triamterene-hydrochlorothiazide (DYAZIDE) 37.5-25 MG capsule Take by mouth.    Marland Kitchen aspirin 81 MG tablet Take 81 mg by mouth daily.    . metoprolol tartrate (LOPRESSOR) 50 MG tablet TAKE 1 TABLET BY MOUTH  TWICE A DAY FOR BLOOD  PRESSURE    . triamterene-hydrochlorothiazide (MAXZIDE-25) 37.5-25 MG tablet TAKE 1 TABLET BY MOUTH  EVERY MORNING FOR FLUID AND BLOOD PRESSURE      No results found for this or any previous visit (from the past 39 hour(s)). No results found.  ROS  Blood pressure 130/77, pulse 90, temperature 98.4 F (36.9 C), temperature source  Oral, resp. rate 15, height 5\' 6"  (1.676 m), weight 78 kg, SpO2 98 %. Physical Exam  Constitutional: He appears well-developed and well-nourished.  HENT:  Mouth/Throat: Oropharynx is clear and moist.  Eyes: Conjunctivae are normal. No scleral icterus.  Neck: No thyromegaly present.  Cardiovascular: Normal rate and regular rhythm.  No murmur heard. Respiratory: Effort normal and breath sounds normal.  GI: Soft. He exhibits no distension and no mass. There is no abdominal tenderness.  Musculoskeletal:        General: No edema.  Lymphadenopathy:    He has no cervical adenopathy.  Neurological: He is alert.  Skin: Skin is warm and dry.     Assessment/Plan History of colonic adenoma. Surveillance colonoscopy.  Hildred Laser, MD 07/10/2018, 7:33 AM

## 2018-07-10 NOTE — Discharge Instructions (Signed)
Hemorrhoids Hemorrhoids are swollen veins in and around the rectum or anus. There are two types of hemorrhoids:  Internal hemorrhoids. These occur in the veins that are just inside the rectum. They may poke through to the outside and become irritated and painful.  External hemorrhoids. These occur in the veins that are outside the anus and can be felt as a painful swelling or hard lump near the anus. Most hemorrhoids do not cause serious problems, and they can be managed with home treatments such as diet and lifestyle changes. If home treatments do not help the symptoms, procedures can be done to shrink or remove the hemorrhoids. What are the causes? This condition is caused by increased pressure in the anal area. This pressure may result from various things, including:  Constipation.  Straining to have a bowel movement.  Diarrhea.  Pregnancy.  Obesity.  Sitting for long periods of time.  Heavy lifting or other activity that causes you to strain.  Anal sex.  Riding a bike for a long period of time. What are the signs or symptoms? Symptoms of this condition include:  Pain.  Anal itching or irritation.  Rectal bleeding.  Leakage of stool (feces).  Anal swelling.  One or more lumps around the anus. How is this diagnosed? This condition can often be diagnosed through a visual exam. Other exams or tests may also be done, such as:  An exam that involves feeling the rectal area with a gloved hand (digital rectal exam).  An exam of the anal canal that is done using a small tube (anoscope).  A blood test, if you have lost a significant amount of blood.  A test to look inside the colon using a flexible tube with a camera on the end (sigmoidoscopy or colonoscopy). How is this treated? This condition can usually be treated at home. However, various procedures may be done if dietary changes, lifestyle changes, and other home treatments do not help your symptoms. These  procedures can help make the hemorrhoids smaller or remove them completely. Some of these procedures involve surgery, and others do not. Common procedures include:  Rubber band ligation. Rubber bands are placed at the base of the hemorrhoids to cut off their blood supply.  Sclerotherapy. Medicine is injected into the hemorrhoids to shrink them.  Infrared coagulation. A type of light energy is used to get rid of the hemorrhoids.  Hemorrhoidectomy surgery. The hemorrhoids are surgically removed, and the veins that supply them are tied off.  Stapled hemorrhoidopexy surgery. The surgeon staples the base of the hemorrhoid to the rectal wall. Follow these instructions at home: Eating and drinking   Eat foods that have a lot of fiber in them, such as whole grains, beans, nuts, fruits, and vegetables.  Ask your health care provider about taking products that have added fiber (fiber supplements).  Reduce the amount of fat in your diet. You can do this by eating low-fat dairy products, eating less red meat, and avoiding processed foods.  Drink enough fluid to keep your urine pale yellow. Managing pain and swelling   Take warm sitz baths for 20 minutes, 3-4 times a day to ease pain and discomfort. You may do this in a bathtub or using a portable sitz bath that fits over the toilet.  If directed, apply ice to the affected area. Using ice packs between sitz baths may be helpful. ? Put ice in a plastic bag. ? Place a towel between your skin and the bag. ? Leave  the ice on for 20 minutes, 2-3 times a day. General instructions  Take over-the-counter and prescription medicines only as told by your health care provider.  Use medicated creams or suppositories as told.  Get regular exercise. Ask your health care provider how much and what kind of exercise is best for you. In general, you should do moderate exercise for at least 30 minutes on most days of the week (150 minutes each week). This can  include activities such as walking, biking, or yoga.  Go to the bathroom when you have the urge to have a bowel movement. Do not wait.  Avoid straining to have bowel movements.  Keep the anal area dry and clean. Use wet toilet paper or moist towelettes after a bowel movement.  Do not sit on the toilet for long periods of time. This increases blood pooling and pain.  Keep all follow-up visits as told by your health care provider. This is important. Contact a health care provider if you have:  Increasing pain and swelling that are not controlled by treatment or medicine.  Difficulty having a bowel movement, or you are unable to have a bowel movement.  Pain or inflammation outside the area of the hemorrhoids. Get help right away if you have:  Uncontrolled bleeding from your rectum. Summary  Hemorrhoids are swollen veins in and around the rectum or anus.  Most hemorrhoids can be managed with home treatments such as diet and lifestyle changes.  Taking warm sitz baths can help ease pain and discomfort.  In severe cases, procedures or surgery can be done to shrink or remove the hemorrhoids. This information is not intended to replace advice given to you by your health care provider. Make sure you discuss any questions you have with your health care provider. Document Released: 07/06/2000 Document Revised: 11/28/2017 Document Reviewed: 11/28/2017 Elsevier Interactive Patient Education  2019 [place]. Colonoscopy, Adult, Care After This sheet gives you information about how to care for yourself after your procedure. Your health care provider may also give you more specific instructions. If you have problems or questions, contact your health care provider. What can I expect after the procedure? After the procedure, it is common to have:  A small amount of blood in your stool for 24 hours after the procedure.  Some gas.  Mild abdominal cramping or bloating. Follow these  instructions at home: General instructions  For the first 24 hours after the procedure: ? Do not drive or use machinery. ? Do not sign important documents. ? Do not drink alcohol. ? Do your regular daily activities at a slower pace than normal. ? Eat soft, easy-to-digest foods.  Take over-the-counter or prescription medicines only as told by your health care provider. Relieving cramping and bloating   Try walking around when you have cramps or feel bloated.  Apply heat to your abdomen as told by your health care provider. Use a heat source that your health care provider recommends, such as a moist heat pack or a heating pad. ? Place a towel between your skin and the heat source. ? Leave the heat on for 20-30 minutes. ? Remove the heat if your skin turns bright red. This is especially important if you are unable to feel pain, heat, or cold. You may have a greater risk of getting burned. Eating and drinking   Drink enough fluid to keep your urine pale yellow.  Resume your normal diet as instructed by your health care provider. Avoid heavy or fried  foods that are hard to digest.  Avoid drinking alcohol for as long as instructed by your health care provider. Contact a health care provider if:  You have blood in your stool 2-3 days after the procedure. Get help right away if:  You have more than a small spotting of blood in your stool.  You pass large blood clots in your stool.  Your abdomen is swollen.  You have nausea or vomiting.  You have a fever.  You have increasing abdominal pain that is not relieved with medicine. Summary  After the procedure, it is common to have a small amount of blood in your stool. You may also have mild abdominal cramping and bloating.  For the first 24 hours after the procedure, do not drive or use machinery, sign important documents, or drink alcohol.  Contact your health care provider if you have a lot of blood in your stool, nausea or  vomiting, a fever, or increased abdominal pain. This information is not intended to replace advice given to you by your health care provider. Make sure you discuss any questions you have with your health care provider. Document Released: 02/21/2004 Document Revised: 05/01/2017 Document Reviewed: 09/20/2015 Elsevier Interactive Patient Education  2019 [place] usual medications and aspirin as before. High-fiber diet. No driving for 24 hours. Next colonoscopy in 7 years.

## 2018-07-18 ENCOUNTER — Encounter (HOSPITAL_COMMUNITY): Payer: Self-pay | Admitting: Internal Medicine

## 2018-09-18 DIAGNOSIS — M7541 Impingement syndrome of right shoulder: Secondary | ICD-10-CM | POA: Diagnosis not present

## 2018-11-04 ENCOUNTER — Other Ambulatory Visit: Payer: Self-pay

## 2018-11-04 ENCOUNTER — Inpatient Hospital Stay (HOSPITAL_COMMUNITY): Payer: Medicare Other | Attending: Hematology

## 2018-11-04 DIAGNOSIS — D696 Thrombocytopenia, unspecified: Secondary | ICD-10-CM | POA: Insufficient documentation

## 2018-11-04 DIAGNOSIS — N189 Chronic kidney disease, unspecified: Secondary | ICD-10-CM | POA: Insufficient documentation

## 2018-11-04 DIAGNOSIS — D751 Secondary polycythemia: Secondary | ICD-10-CM

## 2018-11-04 LAB — CBC WITH DIFFERENTIAL/PLATELET
Abs Immature Granulocytes: 0.02 10*3/uL (ref 0.00–0.07)
Basophils Absolute: 0.1 10*3/uL (ref 0.0–0.1)
Basophils Relative: 1 %
Eosinophils Absolute: 0.3 10*3/uL (ref 0.0–0.5)
Eosinophils Relative: 4 %
HCT: 50.3 % (ref 39.0–52.0)
Hemoglobin: 16.8 g/dL (ref 13.0–17.0)
Immature Granulocytes: 0 %
Lymphocytes Relative: 24 %
Lymphs Abs: 1.8 10*3/uL (ref 0.7–4.0)
MCH: 29.5 pg (ref 26.0–34.0)
MCHC: 33.4 g/dL (ref 30.0–36.0)
MCV: 88.2 fL (ref 80.0–100.0)
Monocytes Absolute: 0.5 10*3/uL (ref 0.1–1.0)
Monocytes Relative: 7 %
Neutro Abs: 4.5 10*3/uL (ref 1.7–7.7)
Neutrophils Relative %: 64 %
Platelets: 148 10*3/uL — ABNORMAL LOW (ref 150–400)
RBC: 5.7 MIL/uL (ref 4.22–5.81)
RDW: 13 % (ref 11.5–15.5)
WBC: 7.2 10*3/uL (ref 4.0–10.5)
nRBC: 0 % (ref 0.0–0.2)

## 2018-11-04 LAB — COMPREHENSIVE METABOLIC PANEL
ALT: 53 U/L — ABNORMAL HIGH (ref 0–44)
AST: 52 U/L — ABNORMAL HIGH (ref 15–41)
Albumin: 4.2 g/dL (ref 3.5–5.0)
Alkaline Phosphatase: 75 U/L (ref 38–126)
Anion gap: 10 (ref 5–15)
BUN: 16 mg/dL (ref 8–23)
CO2: 24 mmol/L (ref 22–32)
Calcium: 9.1 mg/dL (ref 8.9–10.3)
Chloride: 106 mmol/L (ref 98–111)
Creatinine, Ser: 1.55 mg/dL — ABNORMAL HIGH (ref 0.61–1.24)
GFR calc Af Amer: 52 mL/min — ABNORMAL LOW (ref >60)
GFR calc non Af Amer: 45 mL/min — ABNORMAL LOW (ref >60)
Glucose, Bld: 122 mg/dL — ABNORMAL HIGH (ref 70–99)
Potassium: 3.2 mmol/L — ABNORMAL LOW (ref 3.5–5.1)
Sodium: 140 mmol/L (ref 135–145)
Total Bilirubin: 1.7 mg/dL — ABNORMAL HIGH (ref 0.3–1.2)
Total Protein: 6.9 g/dL (ref 6.5–8.1)

## 2018-11-04 LAB — VITAMIN B12: Vitamin B-12: 482 pg/mL (ref 180–914)

## 2018-11-04 LAB — FERRITIN: Ferritin: 44 ng/mL (ref 24–336)

## 2018-11-04 LAB — IRON AND TIBC
Iron: 163 ug/dL (ref 45–182)
Saturation Ratios: 38 % (ref 17.9–39.5)
TIBC: 433 ug/dL (ref 250–450)
UIBC: 270 ug/dL

## 2018-11-04 LAB — LACTATE DEHYDROGENASE: LDH: 136 U/L (ref 98–192)

## 2018-11-04 LAB — FOLATE: Folate: 9.6 ng/mL (ref >5.9)

## 2018-11-11 ENCOUNTER — Encounter (HOSPITAL_COMMUNITY): Payer: Self-pay | Admitting: Hematology

## 2018-11-11 ENCOUNTER — Other Ambulatory Visit: Payer: Self-pay

## 2018-11-11 ENCOUNTER — Inpatient Hospital Stay (HOSPITAL_BASED_OUTPATIENT_CLINIC_OR_DEPARTMENT_OTHER): Payer: Medicare Other | Admitting: Hematology

## 2018-11-11 DIAGNOSIS — D751 Secondary polycythemia: Secondary | ICD-10-CM

## 2019-03-03 DIAGNOSIS — Z0000 Encounter for general adult medical examination without abnormal findings: Secondary | ICD-10-CM | POA: Diagnosis not present

## 2019-03-03 DIAGNOSIS — Z125 Encounter for screening for malignant neoplasm of prostate: Secondary | ICD-10-CM | POA: Diagnosis not present

## 2019-03-03 DIAGNOSIS — N183 Chronic kidney disease, stage 3 (moderate): Secondary | ICD-10-CM | POA: Diagnosis not present

## 2019-03-03 DIAGNOSIS — Z136 Encounter for screening for cardiovascular disorders: Secondary | ICD-10-CM | POA: Diagnosis not present

## 2019-03-03 DIAGNOSIS — Z7289 Other problems related to lifestyle: Secondary | ICD-10-CM | POA: Diagnosis not present

## 2019-03-03 DIAGNOSIS — D45 Polycythemia vera: Secondary | ICD-10-CM | POA: Diagnosis not present

## 2019-03-03 DIAGNOSIS — R7302 Impaired glucose tolerance (oral): Secondary | ICD-10-CM | POA: Diagnosis not present

## 2019-03-03 DIAGNOSIS — Z13220 Encounter for screening for lipoid disorders: Secondary | ICD-10-CM | POA: Diagnosis not present

## 2019-05-13 NOTE — Progress Notes (Signed)
This encounter was created in error - please disregard.

## 2019-10-07 ENCOUNTER — Telehealth: Payer: Self-pay | Admitting: Pulmonary Disease

## 2019-10-07 NOTE — Telephone Encounter (Signed)
Patient states ok to send records to sleep center. Also would like copies sent the him.  States would like them mailed to home address. Patient phone number is [phone].

## 2019-10-07 NOTE — Telephone Encounter (Signed)
Spoke with Margarita Grizzle and she is requesting sleep study from 2018 to be faxed to their office. I called pt to get his consent but he didn't answer. LM for him to call back and let me know if this is ok to send. Will await return call.

## 2019-10-08 NOTE — Telephone Encounter (Signed)
Sleep study has been faxed to [facility]. Copy of report has been mailed to the pt. Nothing further was needed.

## 2020-09-15 ENCOUNTER — Other Ambulatory Visit (HOSPITAL_COMMUNITY): Payer: Self-pay | Admitting: Radiology

## 2020-09-15 DIAGNOSIS — J449 Chronic obstructive pulmonary disease, unspecified: Secondary | ICD-10-CM

## 2020-09-15 DIAGNOSIS — D751 Secondary polycythemia: Secondary | ICD-10-CM

## 2022-09-06 DIAGNOSIS — H524 Presbyopia: Secondary | ICD-10-CM | POA: Diagnosis not present

## 2022-09-06 DIAGNOSIS — H35373 Puckering of macula, bilateral: Secondary | ICD-10-CM | POA: Diagnosis not present

## 2022-11-02 DIAGNOSIS — I10 Essential (primary) hypertension: Secondary | ICD-10-CM | POA: Diagnosis not present

## 2022-11-20 DIAGNOSIS — D751 Secondary polycythemia: Secondary | ICD-10-CM | POA: Diagnosis not present

## 2022-11-30 DIAGNOSIS — I10 Essential (primary) hypertension: Secondary | ICD-10-CM | POA: Diagnosis not present

## 2022-12-05 DIAGNOSIS — R600 Localized edema: Secondary | ICD-10-CM | POA: Diagnosis not present

## 2022-12-05 DIAGNOSIS — I10 Essential (primary) hypertension: Secondary | ICD-10-CM | POA: Diagnosis not present

## 2023-01-01 DIAGNOSIS — I10 Essential (primary) hypertension: Secondary | ICD-10-CM | POA: Diagnosis not present

## 2023-01-01 DIAGNOSIS — M1A9XX0 Chronic gout, unspecified, without tophus (tophi): Secondary | ICD-10-CM | POA: Diagnosis not present

## 2023-02-12 DIAGNOSIS — D751 Secondary polycythemia: Secondary | ICD-10-CM | POA: Diagnosis not present

## 2023-02-19 DIAGNOSIS — D751 Secondary polycythemia: Secondary | ICD-10-CM | POA: Diagnosis not present

## 2023-03-26 DIAGNOSIS — I10 Essential (primary) hypertension: Secondary | ICD-10-CM | POA: Diagnosis not present

## 2023-03-26 DIAGNOSIS — R7303 Prediabetes: Secondary | ICD-10-CM | POA: Diagnosis not present

## 2023-03-26 DIAGNOSIS — Z13220 Encounter for screening for lipoid disorders: Secondary | ICD-10-CM | POA: Diagnosis not present

## 2023-03-26 DIAGNOSIS — D751 Secondary polycythemia: Secondary | ICD-10-CM | POA: Diagnosis not present

## 2023-03-26 DIAGNOSIS — Z136 Encounter for screening for cardiovascular disorders: Secondary | ICD-10-CM | POA: Diagnosis not present

## 2023-03-26 DIAGNOSIS — N1832 Chronic kidney disease, stage 3b: Secondary | ICD-10-CM | POA: Diagnosis not present

## 2023-03-26 DIAGNOSIS — Z0000 Encounter for general adult medical examination without abnormal findings: Secondary | ICD-10-CM | POA: Diagnosis not present
